# Patient Record
Sex: Male | Born: 1995 | Race: White | Hispanic: No | Marital: Single | Smoking: Former smoker
Health system: Southern US, Community
[De-identification: ages and names within clinical notes are randomized; demographics above are authoritative.]

## PROBLEM LIST (undated history)

## (undated) DIAGNOSIS — F39 Unspecified mood [affective] disorder: Secondary | ICD-10-CM

## (undated) DIAGNOSIS — F909 Attention-deficit hyperactivity disorder, unspecified type: Secondary | ICD-10-CM

## (undated) DIAGNOSIS — J45909 Unspecified asthma, uncomplicated: Secondary | ICD-10-CM

## (undated) HISTORY — DX: Unspecified mood (affective) disorder: F39

## (undated) HISTORY — DX: Attention-deficit hyperactivity disorder, unspecified type: F90.9

## (undated) HISTORY — DX: Unspecified asthma, uncomplicated: J45.909

---
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
## 2016-01-04 ENCOUNTER — Ambulatory Visit: Payer: Medicaid Other | Admitting: Family

## 2016-01-07 ENCOUNTER — Encounter: Payer: Self-pay | Admitting: Family

## 2016-01-07 ENCOUNTER — Ambulatory Visit (INDEPENDENT_AMBULATORY_CARE_PROVIDER_SITE_OTHER): Payer: Medicaid Other | Admitting: Family

## 2016-01-07 VITALS — BP 122/75 | HR 81 | Temp 97.6°F | Ht 67.3 in | Wt 138.6 lb

## 2016-01-07 DIAGNOSIS — J069 Acute upper respiratory infection, unspecified: Secondary | ICD-10-CM

## 2016-01-07 DIAGNOSIS — F411 Generalized anxiety disorder: Secondary | ICD-10-CM | POA: Diagnosis not present

## 2016-01-07 MED ORDER — BUPROPION HCL ER (XL) 150 MG PO TB24: 150.0000 mg | ORAL_TABLET | Freq: Every day | ORAL | 1 refills | Status: DC

## 2016-01-07 MED ORDER — AZITHROMYCIN 250 MG PO TABS: ORAL_TABLET | ORAL | 0 refills | Status: DC

## 2016-01-07 MED ORDER — FLUTICASONE PROPIONATE 50 MCG/ACT NA SUSP: 2.0000 | Freq: Every day | NASAL | 6 refills | Status: DC

## 2016-01-07 NOTE — Progress Notes (Signed)
Subjective:    Patient ID: Luke Schwartz, male    DOB: 1995/08/04, 20 y.o.   MRN: [id number]  Anxiety  Presents for follow-up visit. Symptoms include decreased concentration, excessive worry, insomnia, irritability, nervous/anxious behavior and restlessness. Patient reports no shortness of breath or suicidal ideas. Primary symptoms comment: tremors. Symptoms occur most days. The quality of sleep is poor.    URI   This is a new problem. The current episode started in the past 7 days. The problem has been waxing and waning. Associated symptoms include congestion, coughing, ear pain (right), headaches, a plugged ear sensation, rhinorrhea, sinus pain, sneezing and a sore throat. Associated symptoms comments: Chills . He has tried increased fluids for the symptoms. The treatment provided mild relief.      Review of Systems  Constitutional: Positive for irritability.  HENT: Positive for congestion, ear pain (right), rhinorrhea, sinus pain, sneezing and sore throat.   Respiratory: Positive for cough. Negative for shortness of breath.   Neurological: Positive for headaches.  Psychiatric/Behavioral: Positive for decreased concentration. Negative for suicidal ideas. The patient is nervous/anxious and has insomnia.   All other systems reviewed and are negative.      Objective:   Physical Exam  Constitutional: He is oriented to person, place, and time. He appears well-developed and well-nourished. No distress.  HENT:  Head: Normocephalic.  Right Ear: External ear normal.  Left Ear: External ear normal.  Nose: Mucosal edema and rhinorrhea present.  Mouth/Throat: Posterior oropharyngeal erythema present.  Eyes: Pupils are equal, round, and reactive to light. Right eye exhibits no discharge. Left eye exhibits no discharge.  Neck: Normal range of motion. Neck supple. No thyromegaly present.  Cardiovascular: Normal rate, regular rhythm, normal heart sounds and intact distal pulses.   No murmur  heard. Pulmonary/Chest: Effort normal and breath sounds normal. No respiratory distress. He has no wheezes.  Abdominal: Soft. Bowel sounds are normal. He exhibits no distension. There is no tenderness.  Musculoskeletal: Normal range of motion. He exhibits no edema or tenderness.  Neurological: He is alert and oriented to person, place, and time. He has normal reflexes. No cranial nerve deficit.  Skin: Skin is warm and dry. No rash noted. No erythema.  Psychiatric: He has a normal mood and affect. His behavior is normal. Judgment and thought content normal.  Vitals reviewed.     BP 122/75   Pulse 81   Temp 97.6 F (36.4 C) (Oral)   Ht 5' 7.3" (1.709 m)   Wt 138 lb 9.6 oz (62.9 kg)   BMI 21.51 kg/m      Assessment & Plan:  1. Acute upper respiratory infection -- Take meds as prescribed - Use a cool mist humidifier  -Use saline nose sprays frequently -Saline irrigations of the nose can be very helpful if done frequently.  * 4X daily for 1 week*  * Use of a nettie pot can be helpful with this. Follow directions with this* -Force fluids -For any cough or congestion  Use plain Mucinex- regular strength or max strength is fine   * Children- consult with Pharmacist for dosing -For fever or aces or pains- take tylenol or ibuprofen appropriate for age and weight.  * for fevers greater than 101 orally you may alternate ibuprofen and tylenol every  3 hours. -Throat lozenges if help -New toothbrush in 3 days - azithromycin (ZITHROMAX) 250 MG tablet; Take 500 mg once, then 250 mg for four days  Dispense: 6 tablet; Refill: 0 - fluticasone (FLONASE)  50 MCG/ACT nasal spray; Place 2 sprays into both nostrils daily.  Dispense: 16 g; Refill: 6  2. GAD (generalized anxiety disorder) -Zoloft stopped today, wellbutrin ordered today -Stress management discussed -Smoking cessation discussed  -RTO in 6 weeks to recheck GAD - buPROPion (WELLBUTRIN XL) 150 MG 24 hr tablet; Take 1 tablet (150 mg  total) by mouth daily.  Dispense: 90 tablet; Refill: [place], FNP

## 2016-01-07 NOTE — Patient Instructions (Signed)
Upper Respiratory Infection, Adult Most upper respiratory infections (URIs) are a viral infection of the air passages leading to the lungs. A URI affects the nose, throat, and upper air passages. The most common type of URI is nasopharyngitis and is typically referred to as "the common cold." URIs run their course and usually go away on their own. Most of the time, a URI does not require medical attention, but sometimes a bacterial infection in the upper airways can follow a viral infection. This is called a secondary infection. Sinus and middle ear infections are common types of secondary upper respiratory infections. Bacterial pneumonia can also complicate a URI. A URI can worsen asthma and chronic obstructive pulmonary disease (COPD). Sometimes, these complications can require emergency medical care and may be life threatening. What are the causes? Almost all URIs are caused by viruses. A virus is a type of germ and can spread from one person to another. What increases the risk? You may be at risk for a URI if:  You smoke.  You have chronic heart or lung disease.  You have a weakened defense (immune) system.  You are very young or very old.  You have nasal allergies or asthma.  You work in crowded or poorly ventilated areas.  You work in health care facilities or schools.  What are the signs or symptoms? Symptoms typically develop 2-3 days after you come in contact with a cold virus. Most viral URIs last 7-10 days. However, viral URIs from the influenza virus (flu virus) can last 14-18 days and are typically more severe. Symptoms may include:  Runny or stuffy (congested) nose.  Sneezing.  Cough.  Sore throat.  Headache.  Fatigue.  Fever.  Loss of appetite.  Pain in your forehead, behind your eyes, and over your cheekbones (sinus pain).  Muscle aches.  How is this diagnosed? Your health care provider may diagnose a URI by:  Physical exam.  Tests to check that your  symptoms are not due to another condition such as: ? Strep throat. ? Sinusitis. ? Pneumonia. ? Asthma.  How is this treated? A URI goes away on its own with time. It cannot be cured with medicines, but medicines may be prescribed or recommended to relieve symptoms. Medicines may help:  Reduce your fever.  Reduce your cough.  Relieve nasal congestion.  Follow these instructions at home:  Take medicines only as directed by your health care provider.  Gargle warm saltwater or take cough drops to comfort your throat as directed by your health care provider.  Use a warm mist humidifier or inhale steam from a shower to increase air moisture. This may make it easier to breathe.  Drink enough fluid to keep your urine clear or pale yellow.  Eat soups and other clear broths and maintain good nutrition.  Rest as needed.  Return to work when your temperature has returned to normal or as your health care provider advises. You may need to stay home longer to avoid infecting others. You can also use a face mask and careful hand washing to prevent spread of the virus.  Increase the usage of your inhaler if you have asthma.  Do not use any tobacco products, including cigarettes, chewing tobacco, or electronic cigarettes. If you need help quitting, ask your health care provider. How is this prevented? The best way to protect yourself from getting a cold is to practice good hygiene.  Avoid oral or hand contact with people with cold symptoms.  Wash your   hands often if contact occurs.  There is no clear evidence that vitamin C, vitamin E, echinacea, or exercise reduces the chance of developing a cold. However, it is always recommended to get plenty of rest, exercise, and practice good nutrition. Contact a health care provider if:  You are getting worse rather than better.  Your symptoms are not controlled by medicine.  You have chills.  You have worsening shortness of breath.  You have  brown or red mucus.  You have yellow or brown nasal discharge.  You have pain in your face, especially when you bend forward.  You have a fever.  You have swollen neck glands.  You have pain while swallowing.  You have white areas in the back of your throat. Get help right away if:  You have severe or persistent: ? Headache. ? Ear pain. ? Sinus pain. ? Chest pain.  You have chronic lung disease and any of the following: ? Wheezing. ? Prolonged cough. ? Coughing up blood. ? A change in your usual mucus.  You have a stiff neck.  You have changes in your: ? Vision. ? Hearing. ? Thinking. ? Mood. This information is not intended to replace advice given to you by your health care provider. Make sure you discuss any questions you have with your health care provider. Document Released: 08/02/2000 Document Revised: 10/10/2015 Document Reviewed: 05/14/2013 Elsevier Interactive Patient Education  2017 [employer] Inc[employer]  

## 2016-02-29 ENCOUNTER — Ambulatory Visit: Payer: Medicaid Other | Admitting: Family

## 2016-03-01 ENCOUNTER — Encounter: Payer: Self-pay | Admitting: Family

## 2016-03-01 ENCOUNTER — Telehealth: Payer: Self-pay | Admitting: Family

## 2016-03-01 NOTE — Telephone Encounter (Signed)
Full voice mail, unable to leave message.

## 2016-03-07 ENCOUNTER — Ambulatory Visit (INDEPENDENT_AMBULATORY_CARE_PROVIDER_SITE_OTHER): Payer: Medicaid Other | Admitting: Family

## 2016-03-07 ENCOUNTER — Encounter: Payer: Self-pay | Admitting: Family

## 2016-03-07 VITALS — BP 106/53 | HR 65 | Temp 98.2°F | Ht 67.3 in | Wt 136.4 lb

## 2016-03-07 DIAGNOSIS — F39 Unspecified mood [affective] disorder: Secondary | ICD-10-CM

## 2016-03-07 MED ORDER — BUSPIRONE HCL 7.5 MG PO TABS: 7.5000 mg | ORAL_TABLET | Freq: Three times a day (TID) | ORAL | 1 refills | Status: DC | PRN

## 2016-03-07 MED ORDER — BUPROPION HCL ER (XL) 300 MG PO TB24: 300.0000 mg | ORAL_TABLET | Freq: Every day | ORAL | 1 refills | Status: DC

## 2016-03-07 NOTE — Progress Notes (Signed)
   Subjective:    Patient ID: Luke Schwartz, male    DOB: [birth year], 21 y.o.   MRN: [id number]  Pt presents to the office today for GAD follow up.  Anxiety  Presents for follow-up visit. Symptoms include excessive worry, nervous/anxious behavior and restlessness. Patient reports no depressed mood or irritability (at times). Symptoms occur occasionally. The quality of sleep is good.        Review of Systems  Constitutional: Negative for irritability (at times).  Psychiatric/Behavioral: The patient is nervous/anxious.   All other systems reviewed and are negative.      Objective:   Physical Exam  Constitutional: He is oriented to person, place, and time. He appears well-developed and well-nourished. No distress.  HENT:  Head: Normocephalic.  Right Ear: External ear normal.  Left Ear: External ear normal.  Nose: Nose normal.  Mouth/Throat: Oropharynx is clear and moist.  Eyes: Pupils are equal, round, and reactive to light. Right eye exhibits no discharge. Left eye exhibits no discharge.  Neck: Normal range of motion. Neck supple. No thyromegaly present.  Cardiovascular: Normal rate, regular rhythm, normal heart sounds and intact distal pulses.   No murmur heard. Pulmonary/Chest: Effort normal and breath sounds normal. No respiratory distress. He has no wheezes.  Abdominal: Soft. Bowel sounds are normal. He exhibits no distension. There is no tenderness.  Musculoskeletal: Normal range of motion. He exhibits no edema or tenderness.  Neurological: He is alert and oriented to person, place, and time.  Skin: Skin is warm and dry. No rash noted. No erythema.  Psychiatric: He has a normal mood and affect. His behavior is normal. Judgment and thought content normal.  Vitals reviewed.     BP (!) 106/53   Pulse 65   Temp 98.2 F (36.8 C) (Oral)   Ht 5' 7.3" (1.709 m)   Wt 136 lb 6.4 oz (61.9 kg)   BMI 21.17 kg/m      Assessment & Plan:  1. Mood disorder (HCC) -Wellbutrin  increased to 300 mg from 150 -Added Buspar as needed for panic attacks Stress management discussed Smoking cessation discussed  -RTO in 4-6 weeks - buPROPion (WELLBUTRIN XL) 300 MG 24 hr tablet; Take 1 tablet (300 mg total) by mouth daily.  Dispense: 90 tablet; Refill: 1 - busPIRone (BUSPAR) 7.5 MG tablet; Take 1 tablet (7.5 mg total) by mouth 3 (three) times daily as needed.  Dispense: 90 tablet; Refill: [place], FNP

## 2016-03-07 NOTE — Patient Instructions (Signed)
Generalized Anxiety Disorder Generalized anxiety disorder (GAD) is a mental disorder. It interferes with life functions, including relationships, work, and school. GAD is different from normal anxiety, which everyone experiences at some point in their lives in response to specific life events and activities. Normal anxiety actually helps us prepare for and get through these life events and activities. Normal anxiety goes away after the event or activity is over.  GAD causes anxiety that is not necessarily related to specific events or activities. It also causes excess anxiety in proportion to specific events or activities. The anxiety associated with GAD is also difficult to control. GAD can vary from mild to severe. People with severe GAD can have intense waves of anxiety with physical symptoms (panic attacks).  SYMPTOMS The anxiety and worry associated with GAD are difficult to control. This anxiety and worry are related to many life events and activities and also occur more days than not for 6 months or longer. People with GAD also have three or more of the following symptoms (one or more in children):  Restlessness.   Fatigue.  Difficulty concentrating.   Irritability.  Muscle tension.  Difficulty sleeping or unsatisfying sleep. DIAGNOSIS GAD is diagnosed through an assessment by your health care provider. Your health care provider will ask you questions aboutyour mood,physical symptoms, and events in your life. Your health care provider may ask you about your medical history and use of alcohol or drugs, including prescription medicines. Your health care provider may also do a physical exam and blood tests. Certain medical conditions and the use of certain substances can cause symptoms similar to those associated with GAD. Your health care provider may refer you to a mental health specialist for further evaluation. TREATMENT The following therapies are usually used to treat GAD:    Medication. Antidepressant medication usually is prescribed for long-term daily control. Antianxiety medicines may be added in severe cases, especially when panic attacks occur.   Talk therapy (psychotherapy). Certain types of talk therapy can be helpful in treating GAD by providing support, education, and guidance. A form of talk therapy called cognitive behavioral therapy can teach you healthy ways to think about and react to daily life events and activities.  Stress managementtechniques. These include yoga, meditation, and exercise and can be very helpful when they are practiced regularly. A mental health specialist can help determine which treatment is best for you. Some people see improvement with one therapy. However, other people require a combination of therapies. This information is not intended to replace advice given to you by your health care provider. Make sure you discuss any questions you have with your health care provider. Document Released: 06/03/2012 Document Revised: 02/27/2014 Document Reviewed: 06/03/2012 Elsevier Interactive Patient Education  2017 Elsevier Inc.  

## 2016-11-02 ENCOUNTER — Encounter (HOSPITAL_COMMUNITY): Payer: Self-pay | Admitting: Emergency Medicine

## 2016-11-02 ENCOUNTER — Ambulatory Visit (HOSPITAL_COMMUNITY)
Admission: EM | Admit: 2016-11-02 | Discharge: 2016-11-02 | Disposition: A | Discharge status: Home / Self Care | Payer: Self-pay | Attending: Family Medicine | Admitting: Family Medicine

## 2016-11-02 DIAGNOSIS — M791 Myalgia: Secondary | ICD-10-CM

## 2016-11-02 DIAGNOSIS — J020 Streptococcal pharyngitis: Secondary | ICD-10-CM

## 2016-11-02 DIAGNOSIS — R590 Localized enlarged lymph nodes: Secondary | ICD-10-CM

## 2016-11-02 LAB — POCT RAPID STREP A: STREPTOCOCCUS, GROUP A SCREEN (DIRECT): POSITIVE — AB

## 2016-11-02 MED ORDER — DEXAMETHASONE SODIUM PHOSPHATE 10 MG/ML IJ SOLN
10.0000 mg | Freq: Once | INTRAMUSCULAR | Status: AC
  Administered 2016-11-02: 10 mg via INTRAMUSCULAR

## 2016-11-02 MED ORDER — DEXAMETHASONE SODIUM PHOSPHATE 10 MG/ML IJ SOLN
INTRAMUSCULAR | Status: AC
  Filled 2016-11-02: qty 1

## 2016-11-02 MED ORDER — AMOXICILLIN 500 MG PO CAPS: 1000.0000 mg | ORAL_CAPSULE | Freq: Two times a day (BID) | ORAL | 0 refills | Status: DC

## 2016-11-02 NOTE — ED Triage Notes (Signed)
For 3 days have felt tired and aching.  Yesterday and today has noticed pain and swelling to both sides of neck and has a headache.

## 2016-11-02 NOTE — Discharge Instructions (Signed)
Start taking the amoxicillin tonight. He may also start taken Tylenol 650 mg every 4 hours. Start drinking plenty of fluids and staying well-hydrated. Tomorrow you may start the ibuprofen 600 mg every 6 hours as needed. He may also take Tylenol every 4 hours. Rest, no school for the next 2 days. Make sure you take all of your antibiotic. If after 48 hours or so your not feeling any better, the lymph nodes are not beginning to get smaller and you maintain fevers or getting worse return for testing for mono.

## 2016-11-02 NOTE — ED Provider Notes (Signed)
[place]    CSN: [id number] Arrival date & time: 11/02/16  1858     History   Chief Complaint No chief complaint on file.   HPI Daleon Willinger is a 21 y.o. male.   21 year old male feeling tired, achy, fatigue and malaise for the past 3 days. Getting worse. He has developed bilateral anterior cervical lymphadenopathy with notes 2-20 cm. He has an enlarged lymph node in the right posterior neck. Denies sore throat. Denies documented fever.      Past Medical History:  Diagnosis Date  . ADHD (attention deficit hyperactivity disorder)   . Asthma     Patient Active Problem List   Diagnosis Date Noted  . Mood disorder ([place]) 04/27/2015    History reviewed. No pertinent surgical history.     Home Medications    Prior to Admission medications   Medication Sig Start Date End Date Taking? Authorizing Provider  buPROPion (WELLBUTRIN XL) 300 MG 24 hr tablet Take 1 tablet (300 mg total) by mouth daily. 03/07/16   Sharion Balloon, FNP  busPIRone (BUSPAR) 7.5 MG tablet Take 1 tablet (7.5 mg total) by mouth 3 (three) times daily as needed. 03/07/16   Sharion Balloon, FNP  fluticasone (FLONASE) 50 MCG/ACT nasal spray Place 2 sprays into both nostrils daily. 01/07/16   Sharion Balloon, FNP    Family History Family History  Problem Relation Age of Onset  . Hypertension Father   . Depression Father   . Alcohol abuse Father     Social History Social History  Substance Use Topics  . Smoking status: Current Every Day Smoker  . Smokeless tobacco: Never Used  . Alcohol use 0.0 oz/week     Allergies   Vyvanse [lisdexamfetamine dimesylate]   Review of Systems Review of Systems  Constitutional: Negative.   HENT: Positive for sore throat. Negative for dental problem, postnasal drip, sinus pain and trouble swallowing.        Occasional brief right ear pain.  Respiratory: Negative.   Cardiovascular: Negative for chest pain.  Gastrointestinal: Negative.     Genitourinary: Negative.   Musculoskeletal: Positive for myalgias.  Neurological: Negative.   All other systems reviewed and are negative.    Physical Exam Triage Vital Signs ED Triage Vitals  Enc Vitals Group     BP 11/02/16 1946 (!) 109/49     Pulse Rate 11/02/16 1946 81     Resp 11/02/16 1946 18     Temp 11/02/16 1946 99.5 F (37.5 C)     Temp Source 11/02/16 1946 Oral     SpO2 11/02/16 1946 96 %     Weight --      Height --      Head Circumference --      Peak Flow --      Pain Score 11/02/16 1944 8     Pain Loc --      Pain Edu? --      Excl. in [place]? --    No data found.   Updated Vital Signs BP (!) 109/49 (BP Location: Right Arm)   Pulse 81   Temp 99.5 F (37.5 C) (Oral)   Resp 18   SpO2 96%   Visual Acuity Right Eye Distance:   Left Eye Distance:   Bilateral Distance:    Right Eye Near:   Left Eye Near:    Bilateral Near:     Physical Exam  Constitutional: He is oriented to person, place, and time. He appears  well-developed and well-nourished. No distress.  HENT:  Left TM mildly retracted. Right TM obscured by cerumen. Oropharynx erythematous, no exudate, mid posterior pharyngeal cobblestoning and mild  clear PND.  Eyes: EOM are normal.  Neck: Normal range of motion. Neck supple.  Several enlarged bilateral anterior cervical lymph nodes. Tender, pain with rotation of the neck. No posterior or spinal pain or stiffness. There is a solitary enlarged tender lymph node in the right occipital chain.  Cardiovascular: Normal rate, regular rhythm and intact distal pulses.   Pulmonary/Chest: Effort normal and breath sounds normal. No respiratory distress.  Musculoskeletal: He exhibits no edema.  Lymphadenopathy:    He has cervical adenopathy.  Neurological: He is alert and oriented to person, place, and time.  Skin: Skin is warm and dry.  Psychiatric: He has a normal mood and affect.  Nursing note and vitals reviewed.    UC Treatments / Results   Labs (all labs ordered are listed, but only abnormal results are displayed) Labs Reviewed  POCT RAPID STREP A - Abnormal; Notable for the following:       Result Value   Streptococcus, Group A Screen (Direct) POSITIVE (*)    All other components within normal limits    EKG  EKG Interpretation None       Radiology No results found.  Procedures Procedures (including critical care time)  Medications Ordered in UC Medications  dexamethasone (DECADRON) injection 10 mg (not administered)     Initial Impression / Assessment and Plan / UC Course  I have reviewed the triage vital signs and the nursing notes.  Pertinent labs & imaging results that were available during my care of the patient were reviewed by me and considered in my medical decision making (see chart for details).    Start taking the amoxicillin tonight. He may also start taken Tylenol 650 mg every 4 hours. Start drinking plenty of fluids and staying well-hydrated. Tomorrow you may start the ibuprofen 600 mg every 6 hours as needed. He may also take Tylenol every 4 hours. Rest, no school for the next 2 days. Make sure you take all of your antibiotic. If after 48 hours or so your not feeling any better, the lymph nodes are not beginning to get smaller and you maintain fevers or getting worse return for testing for mono.    Final Clinical Impressions(s) / UC Diagnoses   Final diagnoses:  Strep pharyngitis  Lymphadenopathy, cervical    New Prescriptions New Prescriptions   No medications on file     Controlled Substance Prescriptions [place] Controlled Substance Registry consulted? Not Applicable   Janne Napoleon, NP 11/02/16 2045

## 2016-11-05 ENCOUNTER — Emergency Department (HOSPITAL_COMMUNITY)
Admission: EM | Admit: 2016-11-05 | Discharge: 2016-11-05 | Disposition: A | Discharge status: Home / Self Care | Payer: Self-pay | Attending: Emergency Medicine | Admitting: Emergency Medicine

## 2016-11-05 ENCOUNTER — Encounter (HOSPITAL_COMMUNITY): Payer: Self-pay | Admitting: *Deleted

## 2016-11-05 DIAGNOSIS — J020 Streptococcal pharyngitis: Secondary | ICD-10-CM | POA: Insufficient documentation

## 2016-11-05 DIAGNOSIS — F17200 Nicotine dependence, unspecified, uncomplicated: Secondary | ICD-10-CM | POA: Insufficient documentation

## 2016-11-05 DIAGNOSIS — J45909 Unspecified asthma, uncomplicated: Secondary | ICD-10-CM | POA: Insufficient documentation

## 2016-11-05 DIAGNOSIS — Z79899 Other long term (current) drug therapy: Secondary | ICD-10-CM | POA: Insufficient documentation

## 2016-11-05 DIAGNOSIS — F909 Attention-deficit hyperactivity disorder, unspecified type: Secondary | ICD-10-CM | POA: Insufficient documentation

## 2016-11-05 LAB — COMPREHENSIVE METABOLIC PANEL
ALBUMIN: 4 g/dL (ref 3.5–5.0)
ALK PHOS: 69 U/L (ref 38–126)
ALT: 14 U/L — AB (ref 17–63)
AST: 17 U/L (ref 15–41)
Anion gap: 8 (ref 5–15)
BILIRUBIN TOTAL: 0.9 mg/dL (ref 0.3–1.2)
BUN: 10 mg/dL (ref 6–20)
CALCIUM: 9.2 mg/dL (ref 8.9–10.3)
CO2: 25 mmol/L (ref 22–32)
CREATININE: 1.16 mg/dL (ref 0.61–1.24)
Chloride: 102 mmol/L (ref 101–111)
GFR calc Af Amer: >60 mL/min (ref >60)
GFR calc non Af Amer: >60 mL/min (ref >60)
GLUCOSE: 83 mg/dL (ref 65–99)
Potassium: 3.8 mmol/L (ref 3.5–5.1)
SODIUM: 135 mmol/L (ref 135–145)
TOTAL PROTEIN: 7.3 g/dL (ref 6.5–8.1)

## 2016-11-05 LAB — CBC WITH DIFFERENTIAL/PLATELET
BASOS ABS: 0 10*3/uL (ref 0.0–0.1)
BASOS PCT: 0 %
EOS ABS: 0 10*3/uL (ref 0.0–0.7)
Eosinophils Relative: 0 %
HEMATOCRIT: 40.6 % (ref 39.0–52.0)
Hemoglobin: 13.7 g/dL (ref 13.0–17.0)
Lymphocytes Relative: 7 %
Lymphs Abs: 1.1 10*3/uL (ref 0.7–4.0)
MCH: 29.3 pg (ref 26.0–34.0)
MCHC: 33.7 g/dL (ref 30.0–36.0)
MCV: 86.8 fL (ref 78.0–100.0)
MONO ABS: 1.7 10*3/uL — AB (ref 0.1–1.0)
MONOS PCT: 10 %
NEUTROS ABS: 13.6 10*3/uL — AB (ref 1.7–7.7)
Neutrophils Relative %: 83 %
PLATELETS: 203 10*3/uL (ref 150–400)
RBC: 4.68 MIL/uL (ref 4.22–5.81)
RDW: 12.4 % (ref 11.5–15.5)
WBC: 16.5 10*3/uL — ABNORMAL HIGH (ref 4.0–10.5)

## 2016-11-05 LAB — I-STAT CG4 LACTIC ACID, ED: Lactic Acid, Venous: 1.25 mmol/L (ref 0.5–1.9)

## 2016-11-05 LAB — MONONUCLEOSIS SCREEN: Mono Screen: NEGATIVE

## 2016-11-05 MED ORDER — ONDANSETRON 4 MG PO TBDP
4.0000 mg | ORAL_TABLET | Freq: Once | ORAL | Status: AC | PRN
  Administered 2016-11-05: 4 mg via ORAL

## 2016-11-05 MED ORDER — ONDANSETRON 4 MG PO TBDP
ORAL_TABLET | ORAL | Status: AC
  Filled 2016-11-05: qty 1

## 2016-11-05 MED ORDER — KETOROLAC TROMETHAMINE 30 MG/ML IJ SOLN
30.0000 mg | Freq: Once | INTRAMUSCULAR | Status: AC
  Administered 2016-11-05: 30 mg via INTRAVENOUS
  Filled 2016-11-05: qty 1

## 2016-11-05 MED ORDER — SODIUM CHLORIDE 0.9 % IV BOLUS (SEPSIS)
1000.0000 mL | Freq: Once | INTRAVENOUS | Status: AC
  Administered 2016-11-05: 1000 mL via INTRAVENOUS

## 2016-11-05 NOTE — ED Notes (Signed)
Patient able to ambulate independently  

## 2016-11-05 NOTE — ED Notes (Signed)
Patient asking if he can go home after half of his bad of fluids are done.

## 2016-11-05 NOTE — Discharge Instructions (Signed)
Drink plenty of fluids, rest, follow-up with her primary care doctor if not getting better within the week.  Check mychart for your mono results.  As we discussed, treatment for mono would not be different than your current treatment

## 2016-11-05 NOTE — ED Triage Notes (Signed)
Pt reports being diagnosed with strep on 9/13 and has been taking antibiotics since. reports n/v and unable to eat or drink since Friday and has high fever, headache, posterior neck pain. Mask on pt at triage.

## 2016-11-05 NOTE — ED Provider Notes (Signed)
[facility] DEPT Provider Note   CSN: [id number] Arrival date & time: 11/05/16  1113     History   Chief Complaint Chief Complaint  Patient presents with  . Emesis  . Fever    HPI Luke Schwartz is a 21 y.o. male.  HPI Patient started having trouble with this fatigue, malaise and swollen lymph nodes several days ago. He went to an urgent care onSeptember 13. He was noted to have cervical lymphadenopathy and had a positive strep test.Patient was given a prescription for amoxicillin.  Patient states he has not been able to eat or drink wellsince then. He continues to have intermittent fevers up to 101. Hisglands are still swollen and he doesn't feel really any better.  Patient was concerned so he came into the emergency room. Past Medical History:  Diagnosis Date  . ADHD (attention deficit hyperactivity disorder)   . Asthma     Patient Active Problem List   Diagnosis Date Noted  . Mood disorder ([place]) 04/27/2015    History reviewed. No pertinent surgical history.     Home Medications    Prior to Admission medications   Medication Sig Start Date End Date Taking? Authorizing Provider  amoxicillin (AMOXIL) 500 MG capsule Take 2 capsules (1,000 mg total) by mouth 2 (two) times daily. 11/02/16  Yes Mabe, Shanon Brow, NP  buPROPion (WELLBUTRIN XL) 300 MG 24 hr tablet Take 1 tablet (300 mg total) by mouth daily. 03/07/16  Yes Hawks, Christy A, FNP  busPIRone (BUSPAR) 7.5 MG tablet Take 1 tablet (7.5 mg total) by mouth 3 (three) times daily as needed. Patient not taking: Reported on 11/05/2016 03/07/16   Sharion Balloon, FNP  fluticasone [facility]) 50 MCG/ACT nasal spray Place 2 sprays into both nostrils daily. Patient not taking: Reported on 11/05/2016 01/07/16   Sharion Balloon, FNP    Family History Family History  Problem Relation Age of Onset  . Hypertension Father   . Depression Father   . Alcohol abuse Father     Social History Social History  Substance Use Topics  .  Smoking status: Current Every Day Smoker  . Smokeless tobacco: Never Used  . Alcohol use 0.0 oz/week     Allergies   Vyvanse [lisdexamfetamine dimesylate]   Review of Systems Review of Systems  All other systems reviewed and are negative.    Physical Exam Updated Vital Signs BP 118/73 (BP Location: Left Arm)   Pulse 72   Temp 99.3 F (37.4 C) (Oral)   Resp 16   SpO2 95%   Physical Exam  Constitutional: He appears well-developed and well-nourished. No distress.  HENT:  Head: Normocephalic and atraumatic.  Right Ear: External ear normal.  Left Ear: External ear normal.  Mouth/Throat: No oropharyngeal exudate.  Eyes: Conjunctivae are normal. Right eye exhibits no discharge. Left eye exhibits no discharge. No scleral icterus.  Neck: Normal range of motion. Neck supple. No neck rigidity. No tracheal deviation and normal range of motion present.  Cardiovascular: Normal rate, regular rhythm and intact distal pulses.   Pulmonary/Chest: Effort normal and breath sounds normal. No stridor. No respiratory distress. He has no wheezes. He has no rales.  Abdominal: Soft. Bowel sounds are normal. He exhibits no distension. There is no tenderness. There is no rebound and no guarding.  Musculoskeletal: He exhibits no edema or tenderness.  Lymphadenopathy:    He has cervical adenopathy.       Right cervical: Superficial cervical adenopathy present.       Left  cervical: Superficial cervical adenopathy present.  Neurological: He is alert. He has normal strength. No cranial nerve deficit (no facial droop, extraocular movements intact, no slurred speech) or sensory deficit. He exhibits normal muscle tone. He displays no seizure activity. Coordination normal.  Skin: Skin is warm and dry. No rash noted.  Psychiatric: He has a normal mood and affect.  Nursing note and vitals reviewed.    ED Treatments / Results  Labs (all labs ordered are listed, but only abnormal results are  displayed) Labs Reviewed  COMPREHENSIVE METABOLIC PANEL - Abnormal; Notable for the following:       Result Value   ALT 14 (*)    All other components within normal limits  CBC WITH DIFFERENTIAL/PLATELET - Abnormal; Notable for the following:    WBC 16.5 (*)    Neutro Abs 13.6 (*)    Monocytes Absolute 1.7 (*)    All other components within normal limits  MONONUCLEOSIS SCREEN  I-STAT CG4 LACTIC ACID, ED     Radiology No results found.  Procedures Procedures (including critical care time)  Medications Ordered in ED Medications  ondansetron (ZOFRAN-ODT) 4 MG disintegrating tablet (not administered)  ondansetron (ZOFRAN-ODT) disintegrating tablet 4 mg (4 mg Oral Given 11/05/16 1144)  sodium chloride 0.9 % bolus 1,000 mL (1,000 mLs Intravenous New Bag/Given 11/05/16 1456)  ketorolac (TORADOL) 30 MG/ML injection 30 mg (30 mg Intravenous Given 11/05/16 1456)     Initial Impression / Assessment and Plan / ED Course  I have reviewed the triage vital signs and the nursing notes.  Pertinent labs & imaging results that were available during my care of the patient were reviewed by me and considered in my medical decision making (see chart for details).  Clinical Course as of Nov 05 1552  Sun Nov 05, 2016  1551 Feeling better after IV fluids.  Pt wants to go home.  [JK]    Clinical Course User Index [JK] Dorie Rank, MD   Pt with known strep pharyngitis.  No sign of abscess on my exam.  Mono added per patient request.  We discussed that it would not change the management but if it was positive it would explain if it took him longer for the sx to resolve.  After IV fluids he felt much better.  Feels ready for dc.  He can access test results on mychart.  Final Clinical Impressions(s) / ED Diagnoses   Final diagnoses:  Strep pharyngitis    New Prescriptions New Prescriptions   No medications on file     Dorie Rank, MD 11/05/16 1554

## 2016-11-06 ENCOUNTER — Encounter: Payer: Self-pay | Admitting: Family

## 2016-11-06 ENCOUNTER — Ambulatory Visit (INDEPENDENT_AMBULATORY_CARE_PROVIDER_SITE_OTHER): Payer: Self-pay | Admitting: Family

## 2016-11-06 ENCOUNTER — Telehealth: Payer: Self-pay | Admitting: Family Medicine

## 2016-11-06 VITALS — BP 120/66 | HR 82 | Temp 98.0°F | Ht 67.3 in | Wt 133.6 lb

## 2016-11-06 DIAGNOSIS — R112 Nausea with vomiting, unspecified: Secondary | ICD-10-CM

## 2016-11-06 DIAGNOSIS — J020 Streptococcal pharyngitis: Secondary | ICD-10-CM

## 2016-11-06 MED ORDER — ONDANSETRON 4 MG PO TBDP: 4.0000 mg | ORAL_TABLET | Freq: Three times a day (TID) | ORAL | 0 refills | Status: DC | PRN

## 2016-11-06 MED ORDER — CEFTRIAXONE SODIUM 1 G IJ SOLR
1.0000 g | Freq: Once | INTRAMUSCULAR | Status: AC
  Administered 2016-11-06: 1 g via INTRAMUSCULAR

## 2016-11-06 MED ORDER — ONDANSETRON 4 MG PO TBDP
4.0000 mg | ORAL_TABLET | Freq: Three times a day (TID) | ORAL | 0 refills | Status: DC | PRN
Start: 2016-11-06 — End: 2017-02-09

## 2016-11-06 NOTE — Patient Instructions (Signed)
Strep Throat Strep throat is a bacterial infection of the throat. Your health care provider may call the infection tonsillitis or pharyngitis, depending on whether there is swelling in the tonsils or at the back of the throat. Strep throat is most common during the cold months of the year in children who are 5-20 years of age, but it can happen during any season in people of any age. This infection is spread from person to person (contagious) through coughing, sneezing, or close contact. What are the causes? Strep throat is caused by the bacteria called Streptococcus pyogenes. What increases the risk? This condition is more likely to develop in:  People who spend time in crowded places where the infection can spread easily.  People who have close contact with someone who has strep throat.  What are the signs or symptoms? Symptoms of this condition include:  Fever or chills.  Redness, swelling, or pain in the tonsils or throat.  Pain or difficulty when swallowing.  White or yellow spots on the tonsils or throat.  Swollen, tender glands in the neck or under the jaw.  Red rash all over the body (rare).  How is this diagnosed? This condition is diagnosed by performing a rapid strep test or by taking a swab of your throat (throat culture test). Results from a rapid strep test are usually ready in a few minutes, but throat culture test results are available after one or two days. How is this treated? This condition is treated with antibiotic medicine. Follow these instructions at home: Medicines  Take over-the-counter and prescription medicines only as told by your health care provider.  Take your antibiotic as told by your health care provider. Do not stop taking the antibiotic even if you start to feel better.  Have family members who also have a sore throat or fever tested for strep throat. They may need antibiotics if they have the strep infection. Eating and drinking  Do not  share food, drinking cups, or personal items that could cause the infection to spread to other people.  If swallowing is difficult, try eating soft foods until your sore throat feels better.  Drink enough fluid to keep your urine clear or pale yellow. General instructions  Gargle with a salt-water mixture 3-4 times per day or as needed. To make a salt-water mixture, completely dissolve -1 tsp of salt in 1 cup of warm water.  Make sure that all household members wash their hands well.  Get plenty of rest.  Stay home from school or work until you have been taking antibiotics for 24 hours.  Keep all follow-up visits as told by your health care provider. This is important. Contact a health care provider if:  The glands in your neck continue to get bigger.  You develop a rash, cough, or earache.  You cough up a thick liquid that is green, yellow-brown, or bloody.  You have pain or discomfort that does not get better with medicine.  Your problems seem to be getting worse rather than better.  You have a fever. Get help right away if:  You have new symptoms, such as vomiting, severe headache, stiff or painful neck, chest pain, or shortness of breath.  You have severe throat pain, drooling, or changes in your voice.  You have swelling of the neck, or the skin on the neck becomes red and tender.  You have signs of dehydration, such as fatigue, dry mouth, and decreased urination.  You become increasingly sleepy, or   you cannot wake up completely.  Your joints become red or painful. This information is not intended to replace advice given to you by your health care provider. Make sure you discuss any questions you have with your health care provider. Document Released: 02/04/2000 Document Revised: 10/06/2015 Document Reviewed: 06/01/2014 Elsevier Interactive Patient Education  2017 Elsevier Inc.  

## 2016-11-06 NOTE — Progress Notes (Signed)
Subjective:    Patient ID: Luke Schwartz, male    DOB: 10-20-1995, 21 y.o.   MRN: [id number]  Pt presents to the office today for sore throat. PT was seen in the ED the last two days and diagnosed with strep throat. PT was given Amoxicillin 1000mg  BID, but pt states he has been nauseous and vomiting and has not been able to keep the medication down.  Sore Throat   This is a new problem. The current episode started in the past 7 days. The problem has been unchanged. The maximum temperature recorded prior to his arrival was 102 - 102.9 F. The pain is at a severity of 8/10. Associated symptoms include a plugged ear sensation, neck pain, shortness of breath, swollen glands, trouble swallowing and vomiting. Pertinent negatives include no congestion, coughing, ear discharge or ear pain. He has had exposure to strep. He has tried acetaminophen, gargles and NSAIDs for the symptoms. The treatment provided mild relief.      Review of Systems  HENT: Positive for trouble swallowing. Negative for congestion, ear discharge and ear pain.   Respiratory: Positive for shortness of breath. Negative for cough.   Gastrointestinal: Positive for vomiting.  Musculoskeletal: Positive for neck pain.  All other systems reviewed and are negative.      Objective:   Physical Exam  Constitutional: He is oriented to person, place, and time. He appears well-developed and well-nourished. He has a sickly appearance. He appears ill. No distress.  HENT:  Head: Normocephalic.  Right Ear: External ear normal.  Left Ear: External ear normal.  Mouth/Throat: Oropharyngeal exudate, posterior oropharyngeal edema and posterior oropharyngeal erythema present.  Eyes: Pupils are equal, round, and reactive to light. Right eye exhibits no discharge. Left eye exhibits no discharge.  Neck: Normal range of motion. Neck supple. No thyromegaly present.  Cardiovascular: Normal rate, regular rhythm, normal heart sounds and intact distal  pulses.   No murmur heard. Pulmonary/Chest: Effort normal and breath sounds normal. No respiratory distress. He has no wheezes.  Abdominal: Soft. Bowel sounds are normal. He exhibits no distension. There is no tenderness.  Musculoskeletal: Normal range of motion. He exhibits no edema or tenderness.  Lymphadenopathy:    He has cervical adenopathy.  Neurological: He is alert and oriented to person, place, and time. He has normal reflexes. No cranial nerve deficit.  Skin: Skin is warm and dry. No rash noted. No erythema.  Psychiatric: He has a normal mood and affect. His behavior is normal. Judgment and thought content normal.  Vitals reviewed.    BP 120/66   Pulse 82   Temp 98 F (36.7 C) (Oral)   Ht 5' 7.3" (1.709 m)   Wt 133 lb 9.6 oz (60.6 kg)   BMI 20.74 kg/m      Assessment & Plan:  1. Strep throat Continue Amoxicillin  - Take meds as prescribed - Use a cool mist humidifier  -Use saline nose sprays frequently -Force fluids -For fever or aces or pains- take tylenol or ibuprofen appropriate for age and weight. -Throat lozenges if help -New toothbrush in 3 days - ondansetron (ZOFRAN ODT) 4 MG disintegrating tablet; Take 1 tablet (4 mg total) by mouth every 8 (eight) hours as needed for nausea or vomiting.  Dispense: 20 tablet; Refill: 0 - cefTRIAXone (ROCEPHIN) injection 1 g; Inject 1 g into the muscle once.  2. Nausea and vomiting, intractability of vomiting not specified, unspecified vomiting type Take 30 mins prior to taking Amoxicillin  - ondansetron (  ZOFRAN ODT) 4 MG disintegrating tablet; Take 1 tablet (4 mg total) by mouth every 8 (eight) hours as needed for nausea or vomiting.  Dispense: 20 tablet; Refill: 0   Evelina Dun, FNP

## 2016-11-06 NOTE — Telephone Encounter (Signed)
Pt has headache  Pt instructed to alternate Tylenol and Ibuprofen Pt will call back if no improvement

## 2016-11-06 NOTE — Addendum Note (Signed)
Addended by: Evelina Dun A on: 11/06/2016 12:27 PM   Modules accepted: Orders

## 2016-11-09 ENCOUNTER — Ambulatory Visit: Payer: Medicaid Other | Admitting: Family

## 2016-11-13 ENCOUNTER — Encounter: Payer: Self-pay | Admitting: Family

## 2017-02-09 ENCOUNTER — Encounter: Payer: Self-pay | Admitting: Family Medicine

## 2017-02-09 ENCOUNTER — Ambulatory Visit (INDEPENDENT_AMBULATORY_CARE_PROVIDER_SITE_OTHER): Payer: Self-pay | Admitting: Family Medicine

## 2017-02-09 VITALS — BP 117/76 | HR 59 | Temp 99.0°F | Ht 67.3 in | Wt 134.4 lb

## 2017-02-09 DIAGNOSIS — J029 Acute pharyngitis, unspecified: Secondary | ICD-10-CM

## 2017-02-09 LAB — RAPID STREP SCREEN (MED CTR MEBANE ONLY): STREP GP A AG, IA W/REFLEX: NEGATIVE

## 2017-02-09 LAB — CULTURE, GROUP A STREP

## 2017-02-09 MED ORDER — CEFDINIR 300 MG PO CAPS: 300.0000 mg | ORAL_CAPSULE | Freq: Two times a day (BID) | ORAL | 0 refills | Status: DC

## 2017-02-09 MED ORDER — PREDNISONE 20 MG PO TABS: ORAL_TABLET | ORAL | 0 refills | Status: DC

## 2017-02-09 NOTE — Progress Notes (Signed)
BP 117/76   Pulse (!) 59   Temp 99 F (37.2 C) (Oral)   Ht 5' 7.3" (1.709 m)   Wt 134 lb 6.4 oz (61 kg)   BMI 20.86 kg/m    Subjective:    Patient ID: Luke Schwartz, male    DOB: 1995/12/02, 21 y.o.   MRN: [id number]  HPI: Luke Schwartz is a 21 y.o. male presenting on 02/09/2017 for Sore Throat   HPI Sore throat Patient has been having sore throat over the past 5 days and difficulty talking and swelling in the back of his throat.  He denies any fevers or chills or shortness of breath or wheezing.  He says it hurts to swallow and hurts to talk and hurts to open his jaw completely.  He denies any sick contacts that he knows of.  He has gotten strep recently and was concerned that it might be that again.  Relevant past medical, surgical, family and social history reviewed and updated as indicated. Interim medical history since our last visit reviewed. Allergies and medications reviewed and updated.  Review of Systems  Constitutional: Negative for chills and fever.  HENT: Positive for congestion, postnasal drip, rhinorrhea, sinus pressure, sore throat and voice change. Negative for ear discharge, ear pain and sneezing.   Eyes: Negative for pain, discharge, redness and visual disturbance.  Respiratory: Negative for cough, shortness of breath and wheezing.   Cardiovascular: Negative for chest pain and leg swelling.  Musculoskeletal: Negative for gait problem.  Skin: Negative for rash.  All other systems reviewed and are negative.   Per HPI unless specifically indicated above        Objective:    BP 117/76   Pulse (!) 59   Temp 99 F (37.2 C) (Oral)   Ht 5' 7.3" (1.709 m)   Wt 134 lb 6.4 oz (61 kg)   BMI 20.86 kg/m   Wt Readings from Last 3 Encounters:  02/09/17 134 lb 6.4 oz (61 kg)  11/06/16 133 lb 9.6 oz (60.6 kg)  03/07/16 136 lb 6.4 oz (61.9 kg) (21 %, Z= -0.82)*   * Growth percentiles are based on CDC (Boys, 2-20 Years) data.    Physical Exam  Constitutional:  He is oriented to person, place, and time. He appears well-developed and well-nourished. No distress.  HENT:  Right Ear: Tympanic membrane, external ear and ear canal normal.  Left Ear: Tympanic membrane, external ear and ear canal normal.  Nose: Mucosal edema and rhinorrhea present. No sinus tenderness. No epistaxis. Right sinus exhibits maxillary sinus tenderness. Right sinus exhibits no frontal sinus tenderness. Left sinus exhibits maxillary sinus tenderness. Left sinus exhibits no frontal sinus tenderness.  Mouth/Throat: Uvula is midline and mucous membranes are normal. Posterior oropharyngeal edema and posterior oropharyngeal erythema present. No oropharyngeal exudate or tonsillar abscesses.  Eyes: Conjunctivae are normal. No scleral icterus.  Neck: Neck supple.  Cardiovascular: Normal rate, regular rhythm, normal heart sounds and intact distal pulses.  No murmur heard. Pulmonary/Chest: Effort normal and breath sounds normal. No respiratory distress. He has no wheezes. He has no rales.  Musculoskeletal: Normal range of motion. He exhibits no edema.  Lymphadenopathy:    He has cervical adenopathy (2 small mobile anterior cervical lymph nodes that are tender).  Neurological: He is alert and oriented to person, place, and time. Coordination normal.  Skin: Skin is warm and dry. No rash noted. He is not diaphoretic.  Psychiatric: He has a normal mood and affect. His behavior is normal.  Nursing note and vitals reviewed.   Rapid strep negative    Assessment & Plan:   Problem List Items Addressed This Visit    None    Visit Diagnoses    Pharyngitis, unspecified etiology    -  Primary   Relevant Medications   cefdinir (OMNICEF) 300 MG capsule   predniSONE (DELTASONE) 20 MG tablet   Other Relevant Orders   Rapid Strep Screen (Not at [facility])   Mononucleosis screen       Follow up plan: Return if symptoms worsen or fail to improve.  Counseling provided for all of the vaccine  components Orders Placed This Encounter  Procedures  . Rapid Strep Screen (Not at [facility])  . Mononucleosis screen    Caryl Pina, MD [facility] 02/09/2017, 4:19 PM

## 2017-02-10 LAB — MONONUCLEOSIS SCREEN: Mono Screen: NEGATIVE

## 2017-04-19 ENCOUNTER — Telehealth: Payer: Self-pay | Admitting: *Deleted

## 2017-04-19 MED ORDER — BUPROPION HCL ER (XL) 300 MG PO TB24: 300.0000 mg | ORAL_TABLET | Freq: Every day | ORAL | 0 refills | Status: DC

## 2017-04-19 NOTE — Telephone Encounter (Signed)
Will send in rx for 30 days, but Patient NTBS for follow up and lab work

## 2017-04-19 NOTE — Telephone Encounter (Signed)
Fax RF 90d request received Bupropion HCL XL 300 mg tab 1qd

## 2017-05-30 ENCOUNTER — Other Ambulatory Visit: Payer: Self-pay

## 2017-05-30 MED ORDER — BUPROPION HCL ER (XL) 300 MG PO TB24: 300.0000 mg | ORAL_TABLET | Freq: Every day | ORAL | 0 refills | Status: DC

## 2017-05-30 NOTE — Telephone Encounter (Signed)
Last seen 02/09/17  Dr Dettinger PCP

## 2017-09-03 ENCOUNTER — Other Ambulatory Visit: Payer: Self-pay | Admitting: Family Medicine

## 2017-09-04 NOTE — Telephone Encounter (Signed)
Last seen 02/09/17.

## 2018-01-29 ENCOUNTER — Other Ambulatory Visit: Payer: Self-pay | Admitting: Family

## 2018-01-30 ENCOUNTER — Encounter: Payer: Self-pay | Admitting: Family Medicine

## 2018-01-30 NOTE — Telephone Encounter (Signed)
Last seen 02/09/17  Christy  Needs to be seen

## 2018-01-30 NOTE — Telephone Encounter (Signed)
Pt states he see a different doctor and no longer comes here.

## 2018-12-27 ENCOUNTER — Other Ambulatory Visit: Payer: Self-pay

## 2018-12-27 DIAGNOSIS — Z20822 Contact with and (suspected) exposure to covid-19: Secondary | ICD-10-CM

## 2018-12-28 LAB — NOVEL CORONAVIRUS, NAA: SARS-CoV-2, NAA: NOT DETECTED

## 2019-02-15 DIAGNOSIS — J029 Acute pharyngitis, unspecified: Secondary | ICD-10-CM | POA: Diagnosis not present

## 2019-02-15 DIAGNOSIS — H6691 Otitis media, unspecified, right ear: Secondary | ICD-10-CM | POA: Diagnosis not present

## 2020-06-14 ENCOUNTER — Ambulatory Visit (INDEPENDENT_AMBULATORY_CARE_PROVIDER_SITE_OTHER): Payer: BC Managed Care – PPO | Admitting: Family Medicine

## 2020-06-14 ENCOUNTER — Encounter: Payer: Self-pay | Admitting: Family Medicine

## 2020-06-14 VITALS — BP 110/70 | HR 62 | Temp 97.3°F | Ht 66.5 in | Wt 170.4 lb

## 2020-06-14 DIAGNOSIS — Z1329 Encounter for screening for other suspected endocrine disorder: Secondary | ICD-10-CM

## 2020-06-14 DIAGNOSIS — Z13220 Encounter for screening for lipoid disorders: Secondary | ICD-10-CM

## 2020-06-14 DIAGNOSIS — R7401 Elevation of levels of liver transaminase levels: Secondary | ICD-10-CM | POA: Diagnosis not present

## 2020-06-14 DIAGNOSIS — F39 Unspecified mood [affective] disorder: Secondary | ICD-10-CM | POA: Diagnosis not present

## 2020-06-14 DIAGNOSIS — Z0000 Encounter for general adult medical examination without abnormal findings: Secondary | ICD-10-CM | POA: Diagnosis not present

## 2020-06-14 DIAGNOSIS — B351 Tinea unguium: Secondary | ICD-10-CM | POA: Diagnosis not present

## 2020-06-14 DIAGNOSIS — D225 Melanocytic nevi of trunk: Secondary | ICD-10-CM | POA: Diagnosis not present

## 2020-06-14 LAB — COMPREHENSIVE METABOLIC PANEL

## 2020-06-14 LAB — CBC WITH DIFFERENTIAL/PLATELET
EOS (ABSOLUTE): 0.2 10*3/uL (ref 0.0–0.4)
Hematocrit: 43.8 % (ref 37.5–51.0)
Hemoglobin: 15.2 g/dL (ref 13.0–17.7)
Immature Grans (Abs): 0 10*3/uL (ref 0.0–0.1)
MCHC: 34.7 g/dL (ref 31.5–35.7)

## 2020-06-14 LAB — LIPID PANEL

## 2020-06-14 NOTE — Patient Instructions (Addendum)
Use over the counter Lamisil twice daily for the next 2 months.  Change your boots.  Be careful not to spread the fungus.   You may return to have the mole on your back check by [name], PA     Preventive Care 8-24 Years Old, Male Preventive care refers to lifestyle choices and visits with your health care provider that can promote health and wellness. This includes:  A yearly physical exam. This is also called an annual wellness visit.  Regular dental and eye exams.  Immunizations.  Screening for certain conditions.  Healthy lifestyle choices, such as: ? Eating a healthy diet. ? Getting regular exercise. ? Not using drugs or products that contain nicotine and tobacco. ? Limiting alcohol use. What can I expect for my preventive care visit? Physical exam Your health care provider may check your:  Height and weight. These may be used to calculate your BMI (body mass index). BMI is a measurement that tells if you are at a healthy weight.  Heart rate and blood pressure.  Body temperature.  Skin for abnormal spots. Counseling Your health care provider may ask you questions about your:  Past medical problems.  Family's medical history.  Alcohol, tobacco, and drug use.  Emotional well-being.  Home life and relationship well-being.  Sexual activity.  Diet, exercise, and sleep habits.  Work and work Statistician.  Access to firearms. What immunizations do I need? Vaccines are usually given at various ages, according to a schedule. Your health care provider will recommend vaccines for you based on your age, medical history, and lifestyle or other factors, such as travel or where you work.   What tests do I need? Blood tests  Lipid and cholesterol levels. These may be checked every 5 years starting at age 24.  Hepatitis C test.  Hepatitis B test. Screening  Diabetes screening. This is done by checking your blood sugar (glucose) after you have not eaten  for a while (fasting).  Genital exam to check for testicular cancer or hernias.  STD (sexually transmitted disease) testing, if you are at risk. Talk with your health care provider about your test results, treatment options, and if necessary, the need for more tests.   Follow these instructions at home: Eating and drinking  Eat a healthy diet that includes fresh fruits and vegetables, whole grains, lean protein, and low-fat dairy products.  Drink enough fluid to keep your urine pale yellow.  Take vitamin and mineral supplements as recommended by your health care provider.  Do not drink alcohol if your health care provider tells you not to drink.  If you drink alcohol: ? Limit how much you have to 0-2 drinks a day. ? Be aware of how much alcohol is in your drink. In the [place] one drink equals one 12 oz bottle of beer (355 mL), one 5 oz glass of wine (148 mL), or one 1 oz glass of hard liquor (44 mL).   Lifestyle  Take daily care of your teeth and gums. Brush your teeth every morning and night with fluoride toothpaste. Floss one time each day.  Stay active. Exercise for at least 30 minutes 5 or more days each week.  Do not use any products that contain nicotine or tobacco, such as cigarettes, e-cigarettes, and chewing tobacco. If you need help quitting, ask your health care provider.  Do not use drugs.  If you are sexually active, practice safe sex. Use a condom or other form of protection to prevent  STIs (sexually transmitted infections).  Find healthy ways to cope with stress, such as: ? Meditation, yoga, or listening to music. ? Journaling. ? Talking to a trusted person. ? Spending time with friends and family. Safety  Always wear your seat belt while driving or riding in a vehicle.  Do not drive: ? If you have been drinking alcohol. Do not ride with someone who has been drinking. ? When you are tired or distracted. ? While texting.  Wear a helmet and other protective  equipment during sports activities.  If you have firearms in your house, make sure you follow all gun safety procedures.  Seek help if you have been physically or sexually abused. What's next?  Go to your health care provider once a year for an annual wellness visit.  Ask your health care provider how often you should have your eyes and teeth checked.  Stay up to date on all vaccines. This information is not intended to replace advice given to you by your health care provider. Make sure you discuss any questions you have with your health care provider. Document Revised: 10/23/2018 Document Reviewed: 01/31/2018 Elsevier Patient Education  2021 [employer].

## 2020-06-14 NOTE — Progress Notes (Signed)
Subjective:    Patient ID: Luke Schwartz, male    DOB: 1995/12/06, 25 y.o.   MRN: [id number]  HPI Chief Complaint  Patient presents with  . Annual Exam    Fasting    He is new to the practice and here for a complete physical exam. 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 Luke Schwartz in [place].   Complains of foot fungus that seems to be worsening. Is not currently on treatment for this.  States the fungus seems to improve when he does not wear particular pair of boots and recently he has been wearing the same boots.  States his shoes often get wet when he is working and he is unable to change them for several hours.  Intermittent chest pain with a deep inspiration.  This occurs every 1 to 6 months.  This has been ongoing for years and unchanged.  Smoked cigarettes 2017-2018.  Used Juul after that until 2020.  Stopped smoking tobacco completely in 2021.   Smokes marijuana rarely.  Alcohol rarely. Wife is newly supportive.  Stopped other drugs in 2019.   Mood disorder and ADHD-states he manages by working all the time now.  He is looking forward to counseling.  He is not in favor of medication at this time.  He has a mole on his mid back that he would like to have removed.  He is not aware of it changing but it does bother him from time to time when he is scratching his back.   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  Diet: unhealthy  Exercise: nothing regular  Drinks coffee, sprite and water  Energy drinks occasional   Immunizations: Tdap just 4 months ago at [facility]  2 Covid vaccines.   Health maintenance:  Last Dental Exam: 3 months ago  Last Eye Exam: recent   Wears seatbelt always, uses sunscreen, smoke detectors in home and functioning, does not text while driving, feels safe in home environment.  Reviewed allergies, medications, past medical, surgical,  family, and social history.   Review of Systems Review of Systems Constitutional: -fever, -chills, -sweats, -unexpected weight change,-fatigue ENT: -runny nose, -ear pain, -sore throat Cardiology:  +chest pain, -palpitations, -edema Respiratory: -cough, -shortness of breath, -wheezing Gastroenterology: -abdominal pain, -nausea, -vomiting, -diarrhea, -constipation  Hematology: -bleeding or bruising problems Musculoskeletal: -arthralgias, -myalgias, -joint swelling, -back pain Ophthalmology: -vision changes Urology: -dysuria, -difficulty urinating, -hematuria, -urinary frequency, -urgency Neurology: -headache, -weakness, -tingling, -numbness       Objective:   Physical Exam BP 110/70   Pulse 62   Temp (!) 97.3 F (36.3 C)   Ht 5' 6.5" (1.689 m)   Wt 170 lb 6.4 oz (77.3 kg)   SpO2 97%   BMI 27.09 kg/m   General Appearance:    Alert, cooperative, no distress, appears stated age  Head:    Normocephalic, without obvious abnormality, atraumatic  Eyes:    PERRL, conjunctiva/corneas clear, EOM's intact  Ears:    Normal TM's and external ear canals  Nose:  Mask on  Throat:  Mask on  Neck:   Supple, no lymphadenopathy;  thyroid:  no   enlargement/tenderness/nodules; no JVD  Back:    Spine nontender, no curvature, ROM normal, no CVA     tenderness  Lungs:     Clear to auscultation bilaterally without wheezes, rales or     ronchi; respirations unlabored  Chest Wall:    No tenderness or deformity   Heart:    Regular rate and rhythm, S1 and S2 normal, no murmur, rub   or gallop  Breast Exam:    No chest wall tenderness, masses or gynecomastia  Abdomen:     Soft, non-tender, nondistended, normoactive bowel sounds,    no masses, no hepatosplenomegaly  Genitalia:    Normal male external genitalia without lesions.  Testicles without masses.  No inguinal hernias.  Chaperone present   Rectal:   Deferred due to age <40 and lack of symptoms  Extremities:   No clubbing, cyanosis or edema.   Several toenails with thickened and yellowish discoloration noted with the worst being the fourth toe on his left foot.  He has some maceration and scaling between the toes on the left foot.    Pulses:   2+ and symmetric all extremities  Skin:   Skin color, texture, turgor normal, no rashes. 6.5 cm round, raised flesh toned nevus on mid back near spine.   Lymph nodes:   Cervical, supraclavicular, and axillary nodes normal  Neurologic:   CNII-XII intact, normal strength, sensation and gait; reflexes 2+ and symmetric throughout          Psych:   Normal mood, affect, hygiene and grooming.         Assessment & Plan:  Routine general medical examination at a health care facility - Plan: CBC with Differential/Platelet, Comprehensive metabolic panel, TSH, T4, free, Lipid panel -Here today for a new patient CPE.  He is fasting.  Preventive health care reviewed.  Recommend regular self testicular exams.  Counseling on healthy lifestyle including diet and exercise.  Recommend regular dental and eye exams.  Immunizations reviewed.  Discussed safety  Mood disorder (HCC) -Mood appears to be stable.  Congratulated him on starting therapy tomorrow.  Denies self-medicating.  Onychomycosis -Recommend he get rid of the boots that make this issue worse.  Discussed keeping his feet clean and dry.  He will try over-the-counter Lamisil twice daily for the next 8 weeks and let me know if the condition is worsening or not any better at that time.  Screening for thyroid disorder - Plan: TSH, T4, free  Screening for lipid disorders - Plan: Lipid panel  Nevus of back -Discussed that this mole does not look worrisome but if you would like for it to be removed he may schedule a visit with Luke Schwartz he also examined the nevus today.  He may also let me know if he would like a referral to dermatology.

## 2020-06-15 ENCOUNTER — Encounter: Payer: Self-pay | Admitting: Internal Medicine

## 2020-06-15 DIAGNOSIS — F4323 Adjustment disorder with mixed anxiety and depressed mood: Secondary | ICD-10-CM | POA: Diagnosis not present

## 2020-06-15 DIAGNOSIS — R7401 Elevation of levels of liver transaminase levels: Secondary | ICD-10-CM

## 2020-06-15 LAB — COMPREHENSIVE METABOLIC PANEL
ALT: 48 IU/L — ABNORMAL HIGH (ref 0–44)
AST: 32 IU/L (ref 0–40)
Alkaline Phosphatase: 71 IU/L (ref 44–121)
BUN/Creatinine Ratio: 11 (ref 9–20)
Bilirubin Total: 0.4 mg/dL (ref 0.0–1.2)
Calcium: 9.6 mg/dL (ref 8.7–10.2)
Chloride: 101 mmol/L (ref 96–106)
Potassium: 4.1 mmol/L (ref 3.5–5.2)
Sodium: 140 mmol/L (ref 134–144)
Total Protein: 7.3 g/dL (ref 6.0–8.5)
eGFR: 80 mL/min/{1.73_m2} (ref >59)

## 2020-06-15 LAB — CBC WITH DIFFERENTIAL/PLATELET
Basophils Absolute: 0.1 10*3/uL (ref 0.0–0.2)
Basos: 1 %
Eos: 2 %
Immature Granulocytes: 0 %
Lymphocytes Absolute: 2 10*3/uL (ref 0.7–3.1)
Lymphs: 28 %
MCH: 29.4 pg (ref 26.6–33.0)
MCV: 85 fL (ref 79–97)
Monocytes Absolute: 0.6 10*3/uL (ref 0.1–0.9)
Monocytes: 8 %
Neutrophils Absolute: 4.4 10*3/uL (ref 1.4–7.0)
Neutrophils: 61 %
Platelets: 263 10*3/uL (ref 150–450)
RBC: 5.17 x10E6/uL (ref 4.14–5.80)
RDW: 11.9 % (ref 11.6–15.4)
WBC: 7.2 10*3/uL (ref 3.4–10.8)

## 2020-06-15 LAB — T4, FREE: Free T4: 1.24 ng/dL (ref 0.82–1.77)

## 2020-06-15 LAB — TSH: TSH: 1.88 u[IU]/mL (ref 0.450–4.500)

## 2020-06-15 LAB — LIPID PANEL
Cholesterol, Total: 95 mg/dL — ABNORMAL LOW (ref 100–199)
HDL: 47 mg/dL (ref >39)
LDL Chol Calc (NIH): 38 mg/dL (ref 0–99)
Triglycerides: 34 mg/dL (ref 0–149)

## 2020-06-15 NOTE — Progress Notes (Signed)
Please ask Luke Schwartz to add an acute hepatitis panel due to elevated ALT. Let's recheck a CMP in 4 weeks. Make sure he is well hydrated and his kidney function will most likely be normal. Labs are fine otherwise

## 2020-06-16 LAB — ACUTE VIRAL HEPATITIS (HAV, HBV, HCV)
HCV Ab: <0.1 s/co ratio (ref 0.0–0.9)
Hep A IgM: NEGATIVE
Hep B C IgM: NEGATIVE
Hepatitis B Surface Ag: NEGATIVE

## 2020-06-16 LAB — SPECIMEN STATUS REPORT

## 2020-06-16 LAB — HCV INTERPRETATION

## 2020-06-18 ENCOUNTER — Encounter: Payer: Self-pay | Admitting: Family Medicine

## 2020-06-18 DIAGNOSIS — R7401 Elevation of levels of liver transaminase levels: Secondary | ICD-10-CM

## 2020-06-18 HISTORY — DX: Elevation of levels of liver transaminase levels: R74.01

## 2020-06-25 DIAGNOSIS — F4323 Adjustment disorder with mixed anxiety and depressed mood: Secondary | ICD-10-CM | POA: Diagnosis not present

## 2020-06-29 DIAGNOSIS — F4323 Adjustment disorder with mixed anxiety and depressed mood: Secondary | ICD-10-CM | POA: Diagnosis not present

## 2020-07-06 DIAGNOSIS — F4323 Adjustment disorder with mixed anxiety and depressed mood: Secondary | ICD-10-CM | POA: Diagnosis not present

## 2020-07-13 ENCOUNTER — Other Ambulatory Visit: Payer: BC Managed Care – PPO

## 2020-07-13 ENCOUNTER — Other Ambulatory Visit: Payer: Self-pay

## 2020-07-13 DIAGNOSIS — F4323 Adjustment disorder with mixed anxiety and depressed mood: Secondary | ICD-10-CM | POA: Diagnosis not present

## 2020-07-13 DIAGNOSIS — R7401 Elevation of levels of liver transaminase levels: Secondary | ICD-10-CM

## 2020-07-14 LAB — COMPREHENSIVE METABOLIC PANEL
ALT: 47 IU/L — ABNORMAL HIGH (ref 0–44)
AST: 25 IU/L (ref 0–40)
Albumin/Globulin Ratio: 2 (ref 1.2–2.2)
Albumin: 4.7 g/dL (ref 4.1–5.2)
Alkaline Phosphatase: 80 IU/L (ref 44–121)
BUN/Creatinine Ratio: 10 (ref 9–20)
BUN: 13 mg/dL (ref 6–20)
Bilirubin Total: 0.4 mg/dL (ref 0.0–1.2)
CO2: 25 mmol/L (ref 20–29)
Calcium: 9.7 mg/dL (ref 8.7–10.2)
Chloride: 104 mmol/L (ref 96–106)
Creatinine, Ser: 1.26 mg/dL (ref 0.76–1.27)
Globulin, Total: 2.4 g/dL (ref 1.5–4.5)
Glucose: 110 mg/dL — ABNORMAL HIGH (ref 65–99)
Potassium: 4.6 mmol/L (ref 3.5–5.2)
Sodium: 144 mmol/L (ref 134–144)
Total Protein: 7.1 g/dL (ref 6.0–8.5)
eGFR: 82 mL/min/{1.73_m2} (ref >59)

## 2020-07-14 NOTE — Progress Notes (Signed)
His kidney function is back in normal range. Liver enzymes is still just marginally elevated. Let's have him come back in 3 months to recheck it and for an office visit to follow up.

## 2020-07-15 ENCOUNTER — Other Ambulatory Visit: Payer: Self-pay | Admitting: Internal Medicine

## 2020-07-15 DIAGNOSIS — R7401 Elevation of levels of liver transaminase levels: Secondary | ICD-10-CM

## 2020-07-15 NOTE — Progress Notes (Signed)
We can refer him to GI for further evaluation if he would like. Ok to put in referral for elevated ALT or we can monitor his labs over the next few weeks.

## 2020-07-20 DIAGNOSIS — F4323 Adjustment disorder with mixed anxiety and depressed mood: Secondary | ICD-10-CM | POA: Diagnosis not present

## 2020-08-02 DIAGNOSIS — F4323 Adjustment disorder with mixed anxiety and depressed mood: Secondary | ICD-10-CM | POA: Diagnosis not present

## 2020-08-03 DIAGNOSIS — F4323 Adjustment disorder with mixed anxiety and depressed mood: Secondary | ICD-10-CM | POA: Diagnosis not present

## 2020-08-10 DIAGNOSIS — F4323 Adjustment disorder with mixed anxiety and depressed mood: Secondary | ICD-10-CM | POA: Diagnosis not present

## 2020-08-18 ENCOUNTER — Encounter: Payer: Self-pay | Admitting: Internal Medicine

## 2020-08-24 DIAGNOSIS — F4323 Adjustment disorder with mixed anxiety and depressed mood: Secondary | ICD-10-CM | POA: Diagnosis not present

## 2020-09-07 DIAGNOSIS — F4323 Adjustment disorder with mixed anxiety and depressed mood: Secondary | ICD-10-CM | POA: Diagnosis not present

## 2020-09-14 DIAGNOSIS — F4323 Adjustment disorder with mixed anxiety and depressed mood: Secondary | ICD-10-CM | POA: Diagnosis not present

## 2020-09-15 ENCOUNTER — Telehealth: Payer: Self-pay | Admitting: Medical

## 2020-09-15 ENCOUNTER — Ambulatory Visit: Payer: BC Managed Care – PPO | Admitting: Medical

## 2020-09-15 NOTE — Telephone Encounter (Signed)
This patient no showed for their appointment today.Which of the following is necessary for this patient.   A) No follow-up necessary   B) Follow-up urgent. Locate Patient Immediately.   C) Follow-up necessary. Contact patient and Schedule visit in ____ Days.   D) Follow-up Advised. Contact patient and Schedule visit in ____ Days.  Send no show letter  E) Please Send no show letter to patient. Charge no show fee if no show was a CPE.

## 2020-09-22 ENCOUNTER — Other Ambulatory Visit: Payer: Self-pay

## 2020-09-22 ENCOUNTER — Ambulatory Visit (INDEPENDENT_AMBULATORY_CARE_PROVIDER_SITE_OTHER): Payer: Self-pay | Admitting: Family Medicine

## 2020-09-22 VITALS — BP 102/64 | HR 67 | Temp 96.9°F | Wt 173.8 lb

## 2020-09-22 DIAGNOSIS — B351 Tinea unguium: Secondary | ICD-10-CM

## 2020-09-22 DIAGNOSIS — L0231 Cutaneous abscess of buttock: Secondary | ICD-10-CM

## 2020-09-22 NOTE — Progress Notes (Signed)
   Subjective:    Patient ID: Luke Schwartz, male    DOB: [birth year], 25 y.o.   MRN: [id number]  HPI He is here for consult concerning noting a lesion on the left buttock several inches from the anus about 2 weeks ago.  It was slightly uncomfortable but did not drain.  It is slowly resolved.  Make sure that there is nothing else going on.  He also has evidence of onychomycosis and has questions concerning this.   Review of Systems     Objective:   Physical Exam Alert and in no distress.  Exam of the anus is totally normal.  He does have a healing lesion 1 x 2 cm approximately 6 cm from the anus on the left buttock.  Nontender to palpation. He does have evidence of toenail thickening of the fourth toe on the right foot otherwise no other lesions noted.      Assessment & Plan:  Gluteal abscess  Onychomycosis I explained that now the abscess has healed and no intervention is needed.  This occurs again, I recommend that he let us check it out sooner because he might need I&D.  He was comfortable with that. I then discussed the onychomycosis with him explaining that since he is only having 1 toenail involved and the fact that therapy is 60 to 70% effective and needs to be used for at least 3 months, he is less interested in this.  Did go over his liver enzymes and he does have a slight elevation of his ALT which I think is clinically insignificant.

## 2020-09-23 DIAGNOSIS — F4323 Adjustment disorder with mixed anxiety and depressed mood: Secondary | ICD-10-CM | POA: Diagnosis not present

## 2020-09-28 DIAGNOSIS — F4323 Adjustment disorder with mixed anxiety and depressed mood: Secondary | ICD-10-CM | POA: Diagnosis not present

## 2020-10-05 DIAGNOSIS — F4323 Adjustment disorder with mixed anxiety and depressed mood: Secondary | ICD-10-CM | POA: Diagnosis not present

## 2020-10-12 DIAGNOSIS — F4323 Adjustment disorder with mixed anxiety and depressed mood: Secondary | ICD-10-CM | POA: Diagnosis not present

## 2020-10-18 ENCOUNTER — Ambulatory Visit: Payer: BC Managed Care – PPO | Admitting: Family Medicine

## 2020-10-21 ENCOUNTER — Encounter: Payer: Self-pay | Admitting: Internal Medicine

## 2020-11-11 ENCOUNTER — Ambulatory Visit: Payer: Self-pay | Admitting: Internal Medicine

## 2020-11-12 ENCOUNTER — Ambulatory Visit: Payer: BC Managed Care – PPO | Admitting: Family Medicine

## 2020-11-15 ENCOUNTER — Encounter: Payer: Self-pay | Admitting: Family Medicine

## 2020-11-30 DIAGNOSIS — F4323 Adjustment disorder with mixed anxiety and depressed mood: Secondary | ICD-10-CM | POA: Diagnosis not present

## 2020-12-07 DIAGNOSIS — F4323 Adjustment disorder with mixed anxiety and depressed mood: Secondary | ICD-10-CM | POA: Diagnosis not present

## 2020-12-08 ENCOUNTER — Ambulatory Visit: Payer: Self-pay | Admitting: Internal Medicine

## 2020-12-14 DIAGNOSIS — F4323 Adjustment disorder with mixed anxiety and depressed mood: Secondary | ICD-10-CM | POA: Diagnosis not present

## 2020-12-17 ENCOUNTER — Emergency Department (HOSPITAL_BASED_OUTPATIENT_CLINIC_OR_DEPARTMENT_OTHER): Payer: Self-pay | Admitting: Radiology

## 2020-12-17 ENCOUNTER — Other Ambulatory Visit: Payer: Self-pay

## 2020-12-17 ENCOUNTER — Encounter (HOSPITAL_BASED_OUTPATIENT_CLINIC_OR_DEPARTMENT_OTHER): Payer: Self-pay

## 2020-12-17 DIAGNOSIS — R509 Fever, unspecified: Secondary | ICD-10-CM | POA: Insufficient documentation

## 2020-12-17 DIAGNOSIS — R0602 Shortness of breath: Secondary | ICD-10-CM | POA: Insufficient documentation

## 2020-12-17 DIAGNOSIS — R531 Weakness: Secondary | ICD-10-CM | POA: Insufficient documentation

## 2020-12-17 DIAGNOSIS — Z20822 Contact with and (suspected) exposure to covid-19: Secondary | ICD-10-CM | POA: Insufficient documentation

## 2020-12-17 DIAGNOSIS — J45909 Unspecified asthma, uncomplicated: Secondary | ICD-10-CM | POA: Insufficient documentation

## 2020-12-17 LAB — RESP PANEL BY RT-PCR (FLU A&B, COVID) ARPGX2
Influenza A by PCR: NEGATIVE
Influenza B by PCR: NEGATIVE
SARS Coronavirus 2 by RT PCR: NEGATIVE

## 2020-12-17 MED ORDER — ACETAMINOPHEN 500 MG PO TABS
1000.0000 mg | ORAL_TABLET | Freq: Once | ORAL | Status: AC
  Administered 2020-12-17: 1000 mg via ORAL
  Filled 2020-12-17: qty 2

## 2020-12-17 MED ORDER — ONDANSETRON 4 MG PO TBDP
ORAL_TABLET | ORAL | Status: AC
  Filled 2020-12-17: qty 1

## 2020-12-17 NOTE — ED Triage Notes (Signed)
Pt is present weakness, fever, SOB since yesterday. Daughter has been sick from daycare. 101 fever during triage. 98% RA. Hx of asthma.

## 2020-12-18 ENCOUNTER — Emergency Department (HOSPITAL_BASED_OUTPATIENT_CLINIC_OR_DEPARTMENT_OTHER)
Admission: EM | Admit: 2020-12-18 | Discharge: 2020-12-18 | Disposition: A | Discharge status: Home / Self Care | Payer: Self-pay | Attending: Emergency Medicine | Admitting: Emergency Medicine

## 2020-12-20 ENCOUNTER — Telehealth: Payer: Self-pay | Admitting: Family Medicine

## 2020-12-20 NOTE — Telephone Encounter (Signed)
Called pt concerning her recent er visit. Pt states that he had a temp of over 102 and could not get it to go down. He states that he was triaged at ER and given tylenol and sent back out to waiting room, he states that after 6 hours he left. Pt states that he is feeling a lot better not and did not need an appt.

## 2020-12-21 DIAGNOSIS — F4323 Adjustment disorder with mixed anxiety and depressed mood: Secondary | ICD-10-CM | POA: Diagnosis not present

## 2020-12-28 DIAGNOSIS — F4323 Adjustment disorder with mixed anxiety and depressed mood: Secondary | ICD-10-CM | POA: Diagnosis not present

## 2020-12-31 ENCOUNTER — Encounter: Payer: Self-pay | Admitting: *Deleted

## 2021-01-04 DIAGNOSIS — F4323 Adjustment disorder with mixed anxiety and depressed mood: Secondary | ICD-10-CM | POA: Diagnosis not present

## 2021-01-06 DIAGNOSIS — F909 Attention-deficit hyperactivity disorder, unspecified type: Secondary | ICD-10-CM | POA: Diagnosis not present

## 2021-01-07 ENCOUNTER — Ambulatory Visit: Payer: Self-pay | Admitting: Internal Medicine

## 2021-01-11 ENCOUNTER — Encounter: Payer: Self-pay | Admitting: Family Medicine

## 2021-01-11 DIAGNOSIS — F4323 Adjustment disorder with mixed anxiety and depressed mood: Secondary | ICD-10-CM | POA: Diagnosis not present

## 2021-01-20 ENCOUNTER — Telehealth: Payer: BC Managed Care – PPO | Admitting: Medical

## 2021-01-20 ENCOUNTER — Other Ambulatory Visit: Payer: Self-pay

## 2021-01-20 VITALS — Wt 168.0 lb

## 2021-01-20 DIAGNOSIS — R7989 Other specified abnormal findings of blood chemistry: Secondary | ICD-10-CM | POA: Diagnosis not present

## 2021-01-20 DIAGNOSIS — B001 Herpesviral vesicular dermatitis: Secondary | ICD-10-CM | POA: Diagnosis not present

## 2021-01-20 MED ORDER — MUPIROCIN 2 % EX OINT: 1.0000 "application " | TOPICAL_OINTMENT | Freq: Two times a day (BID) | CUTANEOUS | 0 refills | Status: DC

## 2021-01-20 MED ORDER — VALACYCLOVIR HCL 1 G PO TABS: 2000.0000 mg | ORAL_TABLET | Freq: Two times a day (BID) | ORAL | 0 refills | Status: DC

## 2021-01-20 NOTE — Progress Notes (Signed)
Subjective:     Patient ID: Luke Schwartz, male   DOB: May 20, 1995, 25 y.o.   MRN: [id number]  This visit type was conducted due to national recommendations for restrictions regarding the COVID-19 Pandemic (e.g. social distancing) in an effort to limit this patient's exposure and mitigate transmission in our community.  Due to their co-morbid illnesses, this patient is at least at moderate risk for complications without adequate follow up.  This format is felt to be most appropriate for this patient at this time.    Documentation for virtual audio and video telecommunications through [employer] encounter:  The patient was located at home. The provider was located in the office. The patient did consent to this visit and is aware of possible charges through their insurance for this visit.  The other persons participating in this telemedicine service were none. Time spent on call was 20 minutes and in review of previous records 20 minutes total.  This virtual service is not related to other E/M service within previous 7 days.   HPI Chief Complaint  Patient presents with   Other    Cold sores under noses- cluster under left nasal but moving to the left.    Virtual consult for cold sores. he notes a long history of cold sores usually on the lips.  Occasionally has had cold sores under his nose.  He is just now getting over a flulike illness for the past week.  He has had body aches, chills, fever, congestion, cough.  That mostly resolved within the last 2 days but he had a lot of nasal drainage.  Now he has a patch of blisters under each nostril.  He has never been on prescription medicine for cold sores.  He gets them every 1 to 2 months on average.  He uses abbrevia very often.  No other aggravating or relieving factors. No other complaint.  Past Medical History:  Diagnosis Date   ADD (attention deficit disorder with hyperactivity)    impulsive without meds .   ADHD (attention deficit  hyperactivity disorder)    Asthma    Elevated ALT measurement 06/18/2020   Mood disorder ([place])    Current Outpatient Medications on File Prior to Visit  Medication Sig Dispense Refill   Ibuprofen 200 MG CAPS Take by mouth.     Multiple Vitamin (MULTIVITAMIN) tablet Take 1 tablet by mouth daily.     No current facility-administered medications on file prior to visit.     Review of Systems As in subjective    Objective:   Physical Exam Due to coronavirus pandemic stay at home measures, patient visit was virtual and they were not examined in person.   Wt 168 lb (76.2 kg)   BMI 27.12 kg/m   Gen: wd, wn, nad, white male Left nare distally with a patch of erythema and blisters, similar patch of erythema and blisters under the right nare      Assessment:     Encounter Diagnoses  Name Primary?   Recurrent cold sores Yes   Elevated LFTs        Plan:     We discussed symptoms and concerns.  We discussed other differential including impetigo or other bacterial infection.  We also discussed the fact that he has recurrent cold sores.  We discussed the diagnosis of cold sores, general presentation, typical treatment and timeframe to see improvements.  Begin acute therapy of Valtrex and topical mupirocin given the potential for impetigo as well under the nose.  We discussed the potential for preventative therapy if need be going forward.  We discussed proper use of Valtrex.  We discussed prevention of spread of disease   Cold sores A cold sore, also called a fever blister, is a skin infection that is caused by a virus. This infection causes small, fluid-filled sores to form inside of the mouth or on the lips, gums, nose, chin, or cheeks. Cold sores can spread to other parts of the body, such as the eyes or fingers. Cold sores can be spread or passed from person to person (contagious) until the sores crust over completely. Cold sores can be spread through close contact, such as kissing  or sharing a drinking glass.  Sore Care  Do not touch the sores or pick the scabs. Wash your hands often. Do not touch your eyes without washing your hands first. Keep the sores clean and dry. If directed, apply ice to the sores: Put ice in a plastic bag. Place a towel between your skin and the bag. Leave the ice on for 20 minutes, 2-3 times per day. Lifestyle  Do not kiss, have oral sex, or share personal items until your sores heal. Eat a soft, bland diet. Avoid eating hot, cold, or salty foods. These can hurt your mouth. Use a straw if it hurts to drink out of a glass. Avoid the sun and limit your stress if these things trigger outbreaks. If sun causes cold sores, apply sunscreen on your lips before being out in the sun. Contact a doctor if: You have symptoms for more than two weeks. You have pus coming from the sores. You have redness that is spreading. You have pain or irritation in your eye. You get sores on your genitals. Your sores do not heal within two weeks. You get cold sores often.   Elevated liver test-looking back in the chart record he has had some elevated liver test.  He was referred to GI.  He has follow-up with gastroenterology coming up soon  [place] was seen today for other.  Diagnoses and all orders for this visit:  Recurrent cold sores  Elevated LFTs  Other orders -     valACYclovir (VALTREX) 1000 MG tablet; Take 2 tablets (2,000 mg total) by mouth 2 (two) times daily for 2 days. -     mupirocin ointment (BACTROBAN) 2 %; Apply 1 application topically 2 (two) times daily. X 1 week  F/u 2-3 months

## 2021-01-25 DIAGNOSIS — F4323 Adjustment disorder with mixed anxiety and depressed mood: Secondary | ICD-10-CM | POA: Diagnosis not present

## 2021-01-26 DIAGNOSIS — F4323 Adjustment disorder with mixed anxiety and depressed mood: Secondary | ICD-10-CM | POA: Diagnosis not present

## 2021-02-01 DIAGNOSIS — F4323 Adjustment disorder with mixed anxiety and depressed mood: Secondary | ICD-10-CM | POA: Diagnosis not present

## 2021-02-03 DIAGNOSIS — F900 Attention-deficit hyperactivity disorder, predominantly inattentive type: Secondary | ICD-10-CM | POA: Diagnosis not present

## 2021-02-08 DIAGNOSIS — F4323 Adjustment disorder with mixed anxiety and depressed mood: Secondary | ICD-10-CM | POA: Diagnosis not present

## 2021-02-22 DIAGNOSIS — F909 Attention-deficit hyperactivity disorder, unspecified type: Secondary | ICD-10-CM | POA: Diagnosis not present

## 2021-03-02 DIAGNOSIS — F909 Attention-deficit hyperactivity disorder, unspecified type: Secondary | ICD-10-CM | POA: Diagnosis not present

## 2021-03-04 DIAGNOSIS — F900 Attention-deficit hyperactivity disorder, predominantly inattentive type: Secondary | ICD-10-CM | POA: Diagnosis not present

## 2021-03-08 DIAGNOSIS — F909 Attention-deficit hyperactivity disorder, unspecified type: Secondary | ICD-10-CM | POA: Diagnosis not present

## 2021-03-15 DIAGNOSIS — F909 Attention-deficit hyperactivity disorder, unspecified type: Secondary | ICD-10-CM | POA: Diagnosis not present

## 2021-03-22 DIAGNOSIS — F909 Attention-deficit hyperactivity disorder, unspecified type: Secondary | ICD-10-CM | POA: Diagnosis not present

## 2021-03-29 DIAGNOSIS — F909 Attention-deficit hyperactivity disorder, unspecified type: Secondary | ICD-10-CM | POA: Diagnosis not present

## 2021-04-05 DIAGNOSIS — F909 Attention-deficit hyperactivity disorder, unspecified type: Secondary | ICD-10-CM | POA: Diagnosis not present

## 2021-04-12 DIAGNOSIS — F909 Attention-deficit hyperactivity disorder, unspecified type: Secondary | ICD-10-CM | POA: Diagnosis not present

## 2021-04-19 DIAGNOSIS — F909 Attention-deficit hyperactivity disorder, unspecified type: Secondary | ICD-10-CM | POA: Diagnosis not present

## 2021-04-26 DIAGNOSIS — F900 Attention-deficit hyperactivity disorder, predominantly inattentive type: Secondary | ICD-10-CM | POA: Diagnosis not present

## 2021-04-28 ENCOUNTER — Other Ambulatory Visit: Payer: Self-pay | Admitting: Medical

## 2021-05-03 DIAGNOSIS — F900 Attention-deficit hyperactivity disorder, predominantly inattentive type: Secondary | ICD-10-CM | POA: Diagnosis not present

## 2021-05-17 DIAGNOSIS — F900 Attention-deficit hyperactivity disorder, predominantly inattentive type: Secondary | ICD-10-CM | POA: Diagnosis not present

## 2021-05-25 DIAGNOSIS — F900 Attention-deficit hyperactivity disorder, predominantly inattentive type: Secondary | ICD-10-CM | POA: Diagnosis not present

## 2021-06-02 DIAGNOSIS — F900 Attention-deficit hyperactivity disorder, predominantly inattentive type: Secondary | ICD-10-CM | POA: Diagnosis not present

## 2021-06-07 DIAGNOSIS — F900 Attention-deficit hyperactivity disorder, predominantly inattentive type: Secondary | ICD-10-CM | POA: Diagnosis not present

## 2021-06-15 ENCOUNTER — Encounter: Payer: BC Managed Care – PPO | Admitting: Family Medicine

## 2021-06-21 ENCOUNTER — Ambulatory Visit (INDEPENDENT_AMBULATORY_CARE_PROVIDER_SITE_OTHER): Payer: BC Managed Care – PPO | Admitting: Physician Assistant

## 2021-06-21 ENCOUNTER — Encounter: Payer: Self-pay | Admitting: Physician Assistant

## 2021-06-21 VITALS — BP 120/80 | HR 77 | Temp 98.9°F | Ht 66.0 in | Wt 171.0 lb

## 2021-06-21 DIAGNOSIS — F900 Attention-deficit hyperactivity disorder, predominantly inattentive type: Secondary | ICD-10-CM | POA: Diagnosis not present

## 2021-06-21 DIAGNOSIS — R062 Wheezing: Secondary | ICD-10-CM

## 2021-06-21 DIAGNOSIS — J029 Acute pharyngitis, unspecified: Secondary | ICD-10-CM

## 2021-06-21 DIAGNOSIS — R058 Other specified cough: Secondary | ICD-10-CM

## 2021-06-21 DIAGNOSIS — Z0000 Encounter for general adult medical examination without abnormal findings: Secondary | ICD-10-CM | POA: Diagnosis not present

## 2021-06-21 DIAGNOSIS — R509 Fever, unspecified: Secondary | ICD-10-CM

## 2021-06-21 DIAGNOSIS — J309 Allergic rhinitis, unspecified: Secondary | ICD-10-CM

## 2021-06-21 DIAGNOSIS — Z6827 Body mass index (BMI) 27.0-27.9, adult: Secondary | ICD-10-CM

## 2021-06-21 DIAGNOSIS — R7401 Elevation of levels of liver transaminase levels: Secondary | ICD-10-CM

## 2021-06-21 LAB — POC COVID19 BINAXNOW: SARS Coronavirus 2 Ag: NEGATIVE

## 2021-06-21 LAB — POCT INFLUENZA A/B
Influenza A, POC: NEGATIVE
Influenza B, POC: NEGATIVE

## 2021-06-21 LAB — POCT RAPID STREP A (OFFICE): Rapid Strep A Screen: NEGATIVE

## 2021-06-21 MED ORDER — AZITHROMYCIN 250 MG PO TABS: ORAL_TABLET | ORAL | 0 refills | Status: AC

## 2021-06-21 NOTE — Patient Instructions (Addendum)
You can take an OTC decongestant like Sudafed or phenylephrine to help dry up nasal congestion. Do Not take this medicine if you have high blood pressure or heart palpitations. You can go to a store with a pharmacy and ask them to help you find these medicines. ? ?For nasal congestion and post nasal drip you can use an OTC nasal saline rinse as well as one of the OTC nasal steroids (generic equivalent) like Flonase (Fluticasone) or Rhinocort (Budesonide) or Nasacort (Triamcinolone) or Nasonex (Mometasone Furoate).  ? ?You can take an OTC expectorant like guaifenesin or Mucinex to help decrease head and nasal congestion.  ? ?You can take an over the counter antihistamine to help with allergic rhinitis / itching / hives: NON-DROWSY Allegra (Fexofenadine) 180 mg daily or NON-Drowsy Claritin (Loratidine) 10 mg daily  or DROWSY Benadryl (Diphenhydramine) 25 mg as directed or Zyrtec (Cetirizine) 10 mg daily. You can go to a store with a pharmacy and ask them to help you find these medicines. ? ? ?Rest, increase clear fluids, OTC Tylenol (acetamenophen) for body aches, headaches, fever, chills as needed.  ? ?Increase rest and liquids, OTC Tylenol (generic is acetamenophen), Advil or Motrin (generic is ibuprofen) ALWAYS TAKE WITH FOOD, Aleve (generic is naprosyn sodium) ALWAYS TAKE WITH FOOD, warm salt water gargle, OTC throat spray, cough drops as needed. ? ?Preventative Care for Adults, Male ?   ?   REGULAR HEALTH EXAMS: ?A routine yearly physical is a good way to check in with your primary care provider about your health and preventive screening. It is also an opportunity to share updates about your health and any concerns you have, and receive a thorough all-over exam.  ?Most health insurance companies pay for at least some preventative services.  Check with your health plan for specific coverages. ? ?WHAT PREVENTATIVE SERVICES DO MEN NEED? ?Adult men should have their weight and blood pressure checked regularly.  ?Men  age 25 and older should have their cholesterol levels checked regularly. ?Beginning at age 25 and continuing to age 19, men should be screened for colorectal cancer.  Certain people should may need continued testing until age 39. ?Other cancer screening may include exams for testicular and prostate cancer. ?Updating vaccinations is part of preventative care.  Vaccinations help protect against diseases such as the flu. ?Lab tests are generally done as part of preventative care to screen for anemia and blood disorders, to screen for problems with the kidneys and liver, to screen for bladder problems, to check blood sugar, and to check your cholesterol level. ?Preventative services generally include counseling about diet, exercise, avoiding tobacco, drugs, excessive alcohol consumption, and sexually transmitted infections.   ? ?GENERAL RECOMMENDATIONS FOR GOOD HEALTH: ? ?Healthy diet: ?Eat a variety of foods, including fruit, vegetables, animal or vegetable protein, such as meat, fish, chicken, and eggs, or beans, lentils, tofu, and grains, such as rice. ?Drink plenty of water daily (60 - 80 ounces or 8 - 10 glasses of water a day) ?Decrease saturated fat in the diet, avoid lots of red meat, processed foods, sweets, fast foods, and fried foods. For high cholesterol - Increase fiber intake (Benefiber or Metamucil, Cherrios,  oatmeal, beans, nuts, fruits and vegetables), limit saturated fats (in fried foods, red meat), can add OTC fish oil supplement, eat fish with Omega-3 fatty acids like salmon and tuna, exercise for 30 minutes 3 - 5 times a week, drink 8 - 10 glasses of water a day. ? ?Exercise: ?Aerobic exercise helps maintain good  heart health. At least 30-40 minutes of moderate-intensity exercise is recommended. For example, a brisk walk that increases your heart rate and breathing. This should be done on most days of the week.  ?Find a type of exercise or a variety of exercises that you enjoy so that it becomes a  part of your daily life.  Examples are running, walking, swimming, water aerobics, and biking.  For motivation and support, explore group exercise such as aerobic class, spin class, Zumba, Yoga,or  martial arts, etc.   ?Set exercise goals for yourself, such as a certain weight goal, walk or run in a race such as a 5k walk/run.  Speak to your primary care provider about exercise goals. ? ?Disease prevention: ?If you smoke or chew tobacco, find out from your caregiver how to quit. It can literally save your life, no matter how long you have been a tobacco user. If you do not use tobacco, never begin.  ?Maintain a healthy diet and normal weight. Increased weight leads to problems with blood pressure and diabetes.  ?The Body Mass Index or BMI is a way of measuring how much of your body is fat. Having a BMI above 27 increases the risk of heart disease, diabetes, hypertension, stroke and other problems related to obesity. Your caregiver can help determine your BMI and based on it develop an exercise and dietary program to help you achieve or maintain this important measurement at a healthful level. ?High blood pressure causes heart and blood vessel problems.  Persistent high blood pressure should be treated with medicine if weight loss and exercise do not work.  ?Fat and cholesterol leaves deposits in your arteries that can block them. This causes heart disease and vessel disease elsewhere in your body.  If your cholesterol is found to be high, or if you have heart disease or certain other medical conditions, then you may need to have your cholesterol monitored frequently and be treated with medication.  ?Ask if you should have a stress test if your history suggests this. A stress test is a test done on a treadmill that looks for heart disease. This test can find disease prior to there being a problem. ?Avoid drinking alcohol in excess (more than two drinks per day).  Avoid use of street drugs. Do not share needles with  anyone. Ask for professional help if you need assistance or instructions on stopping the use of alcohol, cigarettes, and/or drugs. ?Brush your teeth twice a day with fluoride toothpaste, and floss once a day. Good oral hygiene prevents tooth decay and gum disease. The problems can be painful, unattractive, and can cause other health problems. Visit your dentist for a routine oral and dental check up and preventive care every 6-12 months.  ?Look at your skin regularly.  Use a mirror to look at your back. Notify your caregivers of changes in moles, especially if there are changes in shapes, colors, a size larger than a pencil eraser, an irregular border, or development of new moles. ? ?Safety: ?Use seatbelts 100% of the time, whether driving or as a passenger.  Use safety devices such as hearing protection if you work in environments with loud noise or significant background noise.  Use safety glasses when doing any work that could send debris in to the eyes.  Use a helmet if you ride a bike or motorcycle.  Use appropriate safety gear for contact sports.  Talk to your caregiver about gun safety. ?Use sunscreen with a SPF (or skin  protection factor) of 15 or greater.  Lighter skinned people are at a greater risk of skin cancer. Don?t forget to also wear sunglasses in order to protect your eyes from too much damaging sunlight. Damaging sunlight can accelerate cataract formation.  ?Practice safe sex. Use condoms. Condoms are used for birth control and to help reduce the spread of sexually transmitted infections (or STIs).  Some of the STIs are gonorrhea (the clap), chlamydia, syphilis, trichomonas, herpes, HPV (human papilloma virus) and HIV (human immunodeficiency virus) which causes AIDS. The herpes, HIV and HPV are viral illnesses that have no cure. These can result in disability, cancer and death.  ?Keep carbon monoxide and smoke detectors in your home functioning at all times. Change the batteries every 6 months or  use a model that plugs into the wall.  ? ?Vaccinations: ?Stay up to date with your tetanus shots and other required immunizations. You should have a booster for tetanus every 10 years. Be sure to get y

## 2021-06-21 NOTE — Progress Notes (Signed)
? ? ?Complete physical exam ? ?Patient: Luke Schwartz   DOB: [birth year]   25 y.o. Male  MRN: [id number] ? ?Subjective:  ?  ?Chief Complaint  ?Patient presents with  ? Annual Exam  ?  Non fasting CPE -he is also having cold symptoms  ? ? ?Luke Schwartz is a 26 y.o. male who presents today for a complete physical exam. ?Reports that he is not feeling well; eats a somewhat healthy diet; sleeps 6 - 8 hours a night; drinks about 5 bottles of water a day; works out at [employer] 3 days a week.  ? ?Reports a 5 day history of a cough productive of green phlegm, runny nose, mild allergic rhinitis symptoms, sore throat and is able to eat and drink, fever 100.2 and 100.7 yesterday and today; OTC Tylenol, ibuprofen, and Nyquil were somewhat helpful; today feels achy and chilly, + fatigue; denies taking a home COVID test; denies recent travel; has a Daughter in daycare who often gets sick; reports that he had childhood asthma and as an adult has occasionally had to use an inhaler for wheezing when sick. ? ?Most recent fall risk assessment: ? ?  06/21/2021  ?  8:26 AM  ?Fall Risk   ?Falls in the past year? 0  ?Number falls in past yr: 0  ?Injury with Fall? 0  ?Risk for fall due to : No Fall Risks  ?Follow up Falls evaluation completed  ? ?  ?Most recent depression screenings: ? ?  06/21/2021  ?  8:26 AM 01/20/2021  ? 10:32 AM  ?PHQ 2/9 Scores  ?PHQ - 2 Score 0 0  ? ? ? ? ?Patient Active Problem List  ? Diagnosis Date Noted  ? Allergic rhinitis 06/21/2021  ? Elevated ALT measurement 06/18/2020  ? Nevus of back 06/14/2020  ? Onychomycosis 06/14/2020  ? Mood disorder ([place]) 04/27/2015  ? ?Past Medical History:  ?Diagnosis Date  ? ADD (attention deficit disorder with hyperactivity)   ? impulsive without meds .  ? ADHD (attention deficit hyperactivity disorder)   ? Asthma   ? Elevated ALT measurement 06/18/2020  ? Mood disorder ([place])   ? ?History reviewed. No pertinent surgical history. ?Social History  ? ?Tobacco Use  ? Smoking status:  Former  ? Smokeless tobacco: Never  ?Vaping Use  ? Vaping Use: Former  ?Substance Use Topics  ? Alcohol use: Yes  ?  Alcohol/week: 0.0 standard drinks  ? Drug use: No  ?  Comment: hx of drug abuse   ? ?Family History  ?Problem Relation Age of Onset  ? Hypertension Father   ? Depression Father   ? Alcohol abuse Father   ? ?Allergies  ?Allergen Reactions  ? Vyvanse [Lisdexamfetamine Dimesylate] Palpitations and Other (See Comments)  ?  Pt states that he is not allergic to it. 04/27/15  ? ?  ? ?Patient Care Team: ?Marcellina Millin as PCP - General (Physician Assistant)  ? ?Outpatient Medications Prior to Visit  ?Medication Sig Note  ? valACYclovir (VALTREX) 1000 MG tablet TAKE 2 TABLETS BY MOUTH 2 TIMES DAILY FOR 2 DAYS. 06/21/2021: Taken as need   ? [DISCONTINUED] Ibuprofen 200 MG CAPS Take by mouth. (Patient not taking: Reported on 06/21/2021)   ? [DISCONTINUED] Multiple Vitamin (MULTIVITAMIN) tablet Take 1 tablet by mouth daily. (Patient not taking: Reported on 06/21/2021)   ? [DISCONTINUED] mupirocin ointment (BACTROBAN) 2 % Apply 1 application topically 2 (two) times daily. X 1 week (Patient not taking: Reported on  06/21/2021)   ? ?No facility-administered medications prior to visit.  ? ? ?Review of Systems  ?Constitutional:  Positive for chills, fever and malaise/fatigue.  ?HENT:  Positive for congestion and sore throat. Negative for ear discharge, ear pain and sinus pain.   ?Eyes:  Negative for discharge and redness.  ?Respiratory:  Positive for cough, sputum production and wheezing. Negative for hemoptysis and shortness of breath.   ?Cardiovascular:  Negative for leg swelling.  ?Gastrointestinal:  Negative for constipation, diarrhea, nausea and vomiting.  ?Musculoskeletal:  Positive for myalgias.  ?Skin:  Negative for rash.  ?Neurological:  Negative for dizziness and headaches.  ? ? ? ? ?   ?Objective:  ? ?  ?BP 120/80   Pulse 77   Temp 98.9 ?F (37.2 ?C)   Wt 171 lb (77.6 kg)   SpO2 95%   BMI 27.60 kg/m?  ?BP  Readings from Last 3 Encounters:  ?06/21/21 120/80  ?12/17/20 (!) 148/82  ?09/22/20 102/64  ? ?Wt Readings from Last 3 Encounters:  ?06/21/21 171 lb (77.6 kg)  ?01/20/21 168 lb (76.2 kg)  ?12/17/20 175 lb (79.4 kg)  ? ?  ? ?Physical Exam ?Vitals and nursing note reviewed.  ?Constitutional:   ?   General: He is not in acute distress. ?   Appearance: Normal appearance.  ?HENT:  ?   Head: Normocephalic and atraumatic.  ?   Right Ear: Tympanic membrane, ear canal and external ear normal.  ?   Left Ear: Tympanic membrane, ear canal and external ear normal.  ?   Nose: Congestion present.  ?Eyes:  ?   Extraocular Movements: Extraocular movements intact.  ?   Conjunctiva/sclera: Conjunctivae normal.  ?   Pupils: Pupils are equal, round, and reactive to light.  ?Neck:  ?   Vascular: No carotid bruit.  ?Cardiovascular:  ?   Rate and Rhythm: Normal rate and regular rhythm.  ?   Pulses: Normal pulses.  ?   Heart sounds: Normal heart sounds.  ?Pulmonary:  ?   Effort: Pulmonary effort is normal. No respiratory distress.  ?   Breath sounds: Normal breath sounds. No wheezing or rhonchi.  ?Abdominal:  ?   General: Bowel sounds are normal.  ?   Palpations: Abdomen is soft.  ?Musculoskeletal:     ?   General: Normal range of motion.  ?   Cervical back: Normal range of motion and neck supple.  ?   Right lower leg: No edema.  ?   Left lower leg: No edema.  ?Skin: ?   General: Skin is warm and dry.  ?   Findings: No rash.  ?Neurological:  ?   Mental Status: He is alert and oriented to person, place, and time.  ?   Gait: Gait normal.  ?Psychiatric:     ?   Mood and Affect: Mood normal.     ?   Behavior: Behavior normal.  ?  ? ?No results found for any visits on 06/21/21. ?  ?   ?Assessment & Plan:  ?  ?Routine Health Maintenance and Physical Exam ? ?Immunization History  ?Administered Date(s) Administered  ? DTaP 11/18/1996, 02/02/1997, 04/06/1997, 11/25/1997, 07/30/2001  ? Hepatitis A 10/27/2008  ? Hepatitis B 11/21/96, 02/02/1997,  08/19/1997  ? HiB (PRP-OMP) 11/18/1996, 02/02/1997, 04/06/1997, 11/25/1997  ? IPV 11/18/1996, 02/02/1997, 11/25/1997, 11/12/1998  ? Influenza,inj,quad, With Preservative 04/29/2015  ? Influenza-Unspecified 04/29/2015  ? MMR 11/25/1997, 11/12/1998  ? Meningococcal Conjugate 10/27/2008  ? Tdap 10/27/2008  ? Varicella 11/12/1998, 10/27/2008  ? ? ?  Health Maintenance  ?Topic Date Due  ? COVID-19 Vaccine (1) 10/10/2021 (Originally 02/27/1997)  ? HPV VACCINES (1 - Male 2-dose series) 06/22/2022 (Originally 08/28/2007)  ? TETANUS/TDAP  06/22/2022 (Originally 10/28/2018)  ? INFLUENZA VACCINE  09/20/2021  ? Hepatitis C Screening  Completed  ? HIV Screening  Completed  ? ? ?Discussed health benefits of physical activity, and encouraged him to engage in regular exercise appropriate for his age and condition. ? ?Problem List Items Addressed This Visit   ? ?  ? Respiratory  ? Allergic rhinitis  ?  Stable, OTC antihistamine, nasal steroid spray, and expectorant as needed; if worse can refer to Allergist for further evaluation and allergy testing ? ? ?  ?  ? Relevant Orders  ? POCT rapid strep A   ? POCT Influenza A/B   ? POC COVID-19 BinaxNow   ?  ? Other  ? Elevated ALT measurement  ?  Stable, CMP drawn today ? ?  ?  ? Relevant Orders  ? CBC with Differential/Platelet  ? Comprehensive metabolic panel  ? Lipid panel  ? ?Other Visit Diagnoses   ? ? Routine medical exam    -  Primary  ? Relevant Orders  ? CBC with Differential/Platelet  ? Comprehensive metabolic panel  ? Lipid panel  ? Sore throat      ? Relevant Orders  ? POCT rapid strep A  ? POCT Influenza A/B  ? POC COVID-19 BinaxNow  ? Fever, unspecified fever cause      ? Relevant Orders  ? POCT rapid strep A  ? POCT Influenza A/B  ? POC COVID-19 BinaxNow  ? Cough productive of purulent sputum - Zpak use as directed, You can take OTC cough suppressant as needed like dextromethorphan (DM) in any OTC cough medicine that has the abbreviation DM. ?  ? Wheezing    -stable, hx/o childhood  asthma, patient declined a prescription for albuterol inhaler  ?Rapid strep test, rapid flu test, and rapid COVID tests ALL NEGATIVE ?  ?If symptoms get worse, please call the office for a follow up appointme

## 2021-06-21 NOTE — Assessment & Plan Note (Addendum)
Stable, CMP ordered for the near future ?

## 2021-06-21 NOTE — Assessment & Plan Note (Signed)
Stable, will monitor 

## 2021-06-21 NOTE — Assessment & Plan Note (Signed)
Stable, OTC antihistamine, nasal steroid spray, and expectorant as needed; if worse can refer to Allergist for further evaluation and allergy testing ? ?

## 2021-06-28 DIAGNOSIS — F900 Attention-deficit hyperactivity disorder, predominantly inattentive type: Secondary | ICD-10-CM | POA: Diagnosis not present

## 2021-07-05 DIAGNOSIS — F900 Attention-deficit hyperactivity disorder, predominantly inattentive type: Secondary | ICD-10-CM | POA: Diagnosis not present

## 2021-07-12 ENCOUNTER — Other Ambulatory Visit: Payer: BC Managed Care – PPO

## 2021-07-12 DIAGNOSIS — F900 Attention-deficit hyperactivity disorder, predominantly inattentive type: Secondary | ICD-10-CM | POA: Diagnosis not present

## 2021-07-12 DIAGNOSIS — R7401 Elevation of levels of liver transaminase levels: Secondary | ICD-10-CM | POA: Diagnosis not present

## 2021-07-12 DIAGNOSIS — Z0000 Encounter for general adult medical examination without abnormal findings: Secondary | ICD-10-CM | POA: Diagnosis not present

## 2021-07-13 LAB — LIPID PANEL
Chol/HDL Ratio: 1.7 ratio (ref 0.0–5.0)
Cholesterol, Total: 90 mg/dL — ABNORMAL LOW (ref 100–199)
HDL: 52 mg/dL (ref >39)
LDL Chol Calc (NIH): 29 mg/dL (ref 0–99)
Triglycerides: 23 mg/dL (ref 0–149)
VLDL Cholesterol Cal: 9 mg/dL (ref 5–40)

## 2021-07-13 LAB — COMPREHENSIVE METABOLIC PANEL
ALT: 22 IU/L (ref 0–44)
AST: 22 IU/L (ref 0–40)
Albumin/Globulin Ratio: 1.9 (ref 1.2–2.2)
Albumin: 4.8 g/dL (ref 4.1–5.2)
Alkaline Phosphatase: 73 IU/L (ref 44–121)
BUN/Creatinine Ratio: 11 (ref 9–20)
BUN: 12 mg/dL (ref 6–20)
Bilirubin Total: 0.4 mg/dL (ref 0.0–1.2)
CO2: 25 mmol/L (ref 20–29)
Calcium: 9.6 mg/dL (ref 8.7–10.2)
Chloride: 104 mmol/L (ref 96–106)
Creatinine, Ser: 1.13 mg/dL (ref 0.76–1.27)
Globulin, Total: 2.5 g/dL (ref 1.5–4.5)
Glucose: 88 mg/dL (ref 70–99)
Potassium: 4.5 mmol/L (ref 3.5–5.2)
Sodium: 142 mmol/L (ref 134–144)
Total Protein: 7.3 g/dL (ref 6.0–8.5)
eGFR: 93 mL/min/{1.73_m2} (ref >59)

## 2021-07-13 LAB — CBC WITH DIFFERENTIAL/PLATELET
Basophils Absolute: 0.1 10*3/uL (ref 0.0–0.2)
Basos: 1 %
EOS (ABSOLUTE): 0.4 10*3/uL (ref 0.0–0.4)
Eos: 6 %
Hematocrit: 41.8 % (ref 37.5–51.0)
Hemoglobin: 14.5 g/dL (ref 13.0–17.7)
Immature Grans (Abs): 0 10*3/uL (ref 0.0–0.1)
Immature Granulocytes: 0 %
Lymphocytes Absolute: 1.6 10*3/uL (ref 0.7–3.1)
Lymphs: 25 %
MCH: 29.2 pg (ref 26.6–33.0)
MCHC: 34.7 g/dL (ref 31.5–35.7)
MCV: 84 fL (ref 79–97)
Monocytes Absolute: 0.6 10*3/uL (ref 0.1–0.9)
Monocytes: 9 %
Neutrophils Absolute: 4 10*3/uL (ref 1.4–7.0)
Neutrophils: 59 %
Platelets: 291 10*3/uL (ref 150–450)
RBC: 4.96 x10E6/uL (ref 4.14–5.80)
RDW: 12.3 % (ref 11.6–15.4)
WBC: 6.7 10*3/uL (ref 3.4–10.8)

## 2021-07-21 DIAGNOSIS — F900 Attention-deficit hyperactivity disorder, predominantly inattentive type: Secondary | ICD-10-CM | POA: Diagnosis not present

## 2021-07-26 DIAGNOSIS — F900 Attention-deficit hyperactivity disorder, predominantly inattentive type: Secondary | ICD-10-CM | POA: Diagnosis not present

## 2021-08-02 DIAGNOSIS — F900 Attention-deficit hyperactivity disorder, predominantly inattentive type: Secondary | ICD-10-CM | POA: Diagnosis not present

## 2021-08-10 DIAGNOSIS — F900 Attention-deficit hyperactivity disorder, predominantly inattentive type: Secondary | ICD-10-CM | POA: Diagnosis not present

## 2021-08-16 DIAGNOSIS — F900 Attention-deficit hyperactivity disorder, predominantly inattentive type: Secondary | ICD-10-CM | POA: Diagnosis not present

## 2021-08-19 ENCOUNTER — Encounter: Payer: Self-pay | Admitting: Internal Medicine

## 2021-08-30 DIAGNOSIS — F900 Attention-deficit hyperactivity disorder, predominantly inattentive type: Secondary | ICD-10-CM | POA: Diagnosis not present

## 2021-09-06 DIAGNOSIS — F900 Attention-deficit hyperactivity disorder, predominantly inattentive type: Secondary | ICD-10-CM | POA: Diagnosis not present

## 2021-09-14 DIAGNOSIS — F900 Attention-deficit hyperactivity disorder, predominantly inattentive type: Secondary | ICD-10-CM | POA: Diagnosis not present

## 2021-09-28 DIAGNOSIS — F900 Attention-deficit hyperactivity disorder, predominantly inattentive type: Secondary | ICD-10-CM | POA: Diagnosis not present

## 2021-10-04 DIAGNOSIS — F900 Attention-deficit hyperactivity disorder, predominantly inattentive type: Secondary | ICD-10-CM | POA: Diagnosis not present

## 2021-10-11 DIAGNOSIS — F900 Attention-deficit hyperactivity disorder, predominantly inattentive type: Secondary | ICD-10-CM | POA: Diagnosis not present

## 2021-10-15 DIAGNOSIS — S0501XA Injury of conjunctiva and corneal abrasion without foreign body, right eye, initial encounter: Secondary | ICD-10-CM | POA: Diagnosis not present

## 2021-10-15 DIAGNOSIS — L03213 Periorbital cellulitis: Secondary | ICD-10-CM | POA: Diagnosis not present

## 2021-10-15 DIAGNOSIS — H18823 Corneal disorder due to contact lens, bilateral: Secondary | ICD-10-CM | POA: Diagnosis not present

## 2021-10-15 DIAGNOSIS — H16001 Unspecified corneal ulcer, right eye: Secondary | ICD-10-CM | POA: Diagnosis not present

## 2021-10-18 DIAGNOSIS — F900 Attention-deficit hyperactivity disorder, predominantly inattentive type: Secondary | ICD-10-CM | POA: Diagnosis not present

## 2021-10-26 ENCOUNTER — Encounter: Payer: Self-pay | Admitting: Internal Medicine

## 2021-10-26 DIAGNOSIS — F900 Attention-deficit hyperactivity disorder, predominantly inattentive type: Secondary | ICD-10-CM | POA: Diagnosis not present

## 2021-11-03 DIAGNOSIS — F900 Attention-deficit hyperactivity disorder, predominantly inattentive type: Secondary | ICD-10-CM | POA: Diagnosis not present

## 2021-11-08 DIAGNOSIS — F900 Attention-deficit hyperactivity disorder, predominantly inattentive type: Secondary | ICD-10-CM | POA: Diagnosis not present

## 2021-11-15 DIAGNOSIS — F900 Attention-deficit hyperactivity disorder, predominantly inattentive type: Secondary | ICD-10-CM | POA: Diagnosis not present

## 2021-11-22 DIAGNOSIS — F900 Attention-deficit hyperactivity disorder, predominantly inattentive type: Secondary | ICD-10-CM | POA: Diagnosis not present

## 2021-11-24 DIAGNOSIS — F900 Attention-deficit hyperactivity disorder, predominantly inattentive type: Secondary | ICD-10-CM | POA: Diagnosis not present

## 2021-11-29 ENCOUNTER — Encounter: Payer: Self-pay | Admitting: Internal Medicine

## 2021-11-29 DIAGNOSIS — F900 Attention-deficit hyperactivity disorder, predominantly inattentive type: Secondary | ICD-10-CM | POA: Diagnosis not present

## 2021-12-06 DIAGNOSIS — F900 Attention-deficit hyperactivity disorder, predominantly inattentive type: Secondary | ICD-10-CM | POA: Diagnosis not present

## 2021-12-14 DIAGNOSIS — F900 Attention-deficit hyperactivity disorder, predominantly inattentive type: Secondary | ICD-10-CM | POA: Diagnosis not present

## 2021-12-21 DIAGNOSIS — F900 Attention-deficit hyperactivity disorder, predominantly inattentive type: Secondary | ICD-10-CM | POA: Diagnosis not present

## 2021-12-27 DIAGNOSIS — F900 Attention-deficit hyperactivity disorder, predominantly inattentive type: Secondary | ICD-10-CM | POA: Diagnosis not present

## 2022-01-03 ENCOUNTER — Encounter: Payer: Self-pay | Admitting: Internal Medicine

## 2022-01-04 DIAGNOSIS — F900 Attention-deficit hyperactivity disorder, predominantly inattentive type: Secondary | ICD-10-CM | POA: Diagnosis not present

## 2022-01-11 DIAGNOSIS — F900 Attention-deficit hyperactivity disorder, predominantly inattentive type: Secondary | ICD-10-CM | POA: Diagnosis not present

## 2022-01-17 DIAGNOSIS — F900 Attention-deficit hyperactivity disorder, predominantly inattentive type: Secondary | ICD-10-CM | POA: Diagnosis not present

## 2022-02-07 DIAGNOSIS — F900 Attention-deficit hyperactivity disorder, predominantly inattentive type: Secondary | ICD-10-CM | POA: Diagnosis not present

## 2022-02-08 ENCOUNTER — Other Ambulatory Visit: Payer: Self-pay | Admitting: Physician Assistant

## 2022-02-08 NOTE — Telephone Encounter (Signed)
Refill request last apt 07/12/21

## 2022-02-21 DIAGNOSIS — F900 Attention-deficit hyperactivity disorder, predominantly inattentive type: Secondary | ICD-10-CM | POA: Diagnosis not present

## 2022-03-07 DIAGNOSIS — F900 Attention-deficit hyperactivity disorder, predominantly inattentive type: Secondary | ICD-10-CM | POA: Diagnosis not present

## 2022-03-16 DIAGNOSIS — F900 Attention-deficit hyperactivity disorder, predominantly inattentive type: Secondary | ICD-10-CM | POA: Diagnosis not present

## 2022-04-04 DIAGNOSIS — F900 Attention-deficit hyperactivity disorder, predominantly inattentive type: Secondary | ICD-10-CM | POA: Diagnosis not present

## 2022-04-20 DIAGNOSIS — F900 Attention-deficit hyperactivity disorder, predominantly inattentive type: Secondary | ICD-10-CM | POA: Diagnosis not present

## 2022-05-02 DIAGNOSIS — F900 Attention-deficit hyperactivity disorder, predominantly inattentive type: Secondary | ICD-10-CM | POA: Diagnosis not present

## 2022-05-16 DIAGNOSIS — F900 Attention-deficit hyperactivity disorder, predominantly inattentive type: Secondary | ICD-10-CM | POA: Diagnosis not present

## 2022-05-30 DIAGNOSIS — F900 Attention-deficit hyperactivity disorder, predominantly inattentive type: Secondary | ICD-10-CM | POA: Diagnosis not present

## 2022-06-23 ENCOUNTER — Encounter: Payer: BC Managed Care – PPO | Admitting: Physician Assistant

## 2022-11-13 ENCOUNTER — Other Ambulatory Visit: Payer: Self-pay | Admitting: Nurse Practitioner

## 2022-12-04 ENCOUNTER — Other Ambulatory Visit: Payer: Self-pay

## 2022-12-04 ENCOUNTER — Telehealth: Payer: Self-pay | Admitting: Physician Assistant

## 2022-12-04 MED ORDER — VALACYCLOVIR HCL 1 G PO TABS: 1000.0000 mg | ORAL_TABLET | Freq: Two times a day (BID) | ORAL | 0 refills | Status: AC

## 2022-12-04 NOTE — Telephone Encounter (Signed)
Pt called and is requesting that his valtex be sent to a new pharmacy [facility]  States that his insurance will not cover it at [facility] Also states that he did not get the rx sept the 24th

## 2023-11-13 ENCOUNTER — Other Ambulatory Visit: Payer: Self-pay

## 2023-11-13 ENCOUNTER — Emergency Department (HOSPITAL_BASED_OUTPATIENT_CLINIC_OR_DEPARTMENT_OTHER)
Admission: EM | Admit: 2023-11-13 | Discharge: 2023-11-13 | Disposition: A | Discharge status: Home / Self Care | Attending: Emergency Medicine | Admitting: Emergency Medicine

## 2023-11-13 ENCOUNTER — Encounter (HOSPITAL_BASED_OUTPATIENT_CLINIC_OR_DEPARTMENT_OTHER): Payer: Self-pay | Admitting: Emergency Medicine

## 2023-11-13 DIAGNOSIS — H6691 Otitis media, unspecified, right ear: Secondary | ICD-10-CM | POA: Diagnosis not present

## 2023-11-13 DIAGNOSIS — H9201 Otalgia, right ear: Secondary | ICD-10-CM | POA: Diagnosis present

## 2023-11-13 DIAGNOSIS — H6690 Otitis media, unspecified, unspecified ear: Secondary | ICD-10-CM

## 2023-11-13 MED ORDER — AMOXICILLIN-POT CLAVULANATE 875-125 MG PO TABS: 1.0000 | ORAL_TABLET | Freq: Two times a day (BID) | ORAL | 0 refills | Status: AC

## 2023-11-13 MED ORDER — AMOXICILLIN-POT CLAVULANATE 875-125 MG PO TABS
1.0000 | ORAL_TABLET | Freq: Once | ORAL | Status: AC
  Administered 2023-11-13: 1 via ORAL
  Filled 2023-11-13: qty 1

## 2023-11-13 MED ORDER — KETOROLAC TROMETHAMINE 60 MG/2ML IM SOLN
60.0000 mg | Freq: Once | INTRAMUSCULAR | Status: AC
  Administered 2023-11-13: 60 mg via INTRAMUSCULAR
  Filled 2023-11-13: qty 2

## 2023-11-13 MED ORDER — DEXAMETHASONE 4 MG PO TABS
10.0000 mg | ORAL_TABLET | Freq: Once | ORAL | Status: AC
  Administered 2023-11-13: 10 mg via ORAL
  Filled 2023-11-13: qty 3

## 2023-11-13 NOTE — ED Triage Notes (Addendum)
 Patient reports right ear pain that started this afternoon and progressed throughout the day. Pain goes down into his right side of his face. Reports being sick recently with cold like symptoms which have since resolved.

## 2023-11-13 NOTE — ED Provider Notes (Signed)
 [place] EMERGENCY DEPARTMENT AT [facility] Provider Note   CSN: [id number] Arrival date & time: 11/13/23  2145     Patient presents with: Otalgia   Luke Schwartz is a 28 y.o. male.   Patient here with right ear pain that started this afternoon.  Has been sick recently congestion.  No major medical problems.  Has had no fever or chills.  Feels like a lot of right ear fullness.  Denies any difficulty eating or drinking.  No throat pain.  The history is provided by the patient.       Prior to Admission medications   Medication Sig Start Date End Date Taking? Authorizing Provider  amoxicillin -clavulanate (AUGMENTIN ) 875-125 MG tablet Take 1 tablet by mouth every 12 (twelve) hours. 11/13/23  Yes Bailee Thall, DO  valACYclovir  (VALTREX ) 1000 MG tablet Take 1 tablet (1,000 mg total) by mouth 2 (two) times daily. 12/04/22   Oris Camie BRAVO, NP    Allergies: Vyvanse  [lisdexamfetamine dimesylate ]    Review of Systems  Updated Vital Signs BP (!) 141/85 (BP Location: Right Arm)   Pulse 67   Temp 97.7 F (36.5 C) (Oral)   Resp 17   Ht 5' 6 (1.676 m)   Wt 77.1 kg   SpO2 99%   BMI 27.44 kg/m   Physical Exam Vitals and nursing note reviewed.  Constitutional:      General: He is not in acute distress.    Appearance: He is well-developed. He is not ill-appearing.  HENT:     Head: Normocephalic and atraumatic.     Comments: Right bulging TM with erythema and effusion, left TM unremarkable    Nose: Nose normal.     Mouth/Throat:     Mouth: Mucous membranes are moist.     Pharynx: No oropharyngeal exudate or posterior oropharyngeal erythema.  Eyes:     Extraocular Movements: Extraocular movements intact.     Conjunctiva/sclera: Conjunctivae normal.     Pupils: Pupils are equal, round, and reactive to light.  Cardiovascular:     Rate and Rhythm: Normal rate and regular rhythm.     Pulses: Normal pulses.     Heart sounds: Normal heart sounds. No murmur  heard. Pulmonary:     Effort: Pulmonary effort is normal. No respiratory distress.     Breath sounds: Normal breath sounds.  Abdominal:     Palpations: Abdomen is soft.     Tenderness: There is no abdominal tenderness.  Musculoskeletal:        General: No swelling.     Cervical back: Normal range of motion and neck supple.  Skin:    General: Skin is warm and dry.     Capillary Refill: Capillary refill takes less than 2 seconds.  Neurological:     Mental Status: He is alert.  Psychiatric:        Mood and Affect: Mood normal.     (all labs ordered are listed, but only abnormal results are displayed) Labs Reviewed - No data to display  EKG: None  Radiology: No results found.   Procedures   Medications Ordered in the ED  dexamethasone  (DECADRON ) tablet 10 mg (has no administration in time range)  ketorolac  (TORADOL ) injection 60 mg (has no administration in time range)  amoxicillin -clavulanate (AUGMENTIN ) 875-125 MG per tablet 1 tablet (has no administration in time range)  Medical Decision Making Risk Prescription drug management.   Luke Schwartz is here with right ear pain.  Looks like he has an otitis media with bulging TM and effusion in the right ear.  Otherwise nasal congestion.  Will treat empirically with antibiotics.  Given Decadron  Toradol  here for pain management.  I do not appreciate any ruptured TM.  Take antibiotic as prescribed follow-up with primary care.  Told to return if symptoms worsen.  No concern for mastoiditis or other acute process at this time.  No throat pain and no concern for throat infection.  This chart was dictated using voice recognition software.  Despite best efforts to proofread,  errors can occur which can change the documentation meaning.      Final diagnoses:  Acute otitis media, unspecified otitis media type    ED Discharge Orders          Ordered    amoxicillin -clavulanate (AUGMENTIN )  875-125 MG tablet  Every 12 hours        11/13/23 2216               Ruthe Cornet, DO 11/13/23 2218

## 2023-11-13 NOTE — Discharge Instructions (Signed)
 Follow-up with your primary care doctor if not improving after antibiotics.  Return if symptoms worsen.  Recommend 1000 mg of Tylenol  every 6 hours as needed for pain, recommend 800 mg ibuprofen every 8 hours as needed for pain.  Take next dose of antibiotic tomorrow.
